# Patient Record
Sex: Female | Born: 1970 | Hispanic: Yes | Smoking: Never smoker
Health system: Southern US, Community
[De-identification: ages and names within clinical notes are randomized; demographics above are authoritative.]

## PROBLEM LIST (undated history)

## (undated) DIAGNOSIS — R7303 Prediabetes: Secondary | ICD-10-CM

## (undated) DIAGNOSIS — Z5189 Encounter for other specified aftercare: Secondary | ICD-10-CM

## (undated) DIAGNOSIS — E785 Hyperlipidemia, unspecified: Secondary | ICD-10-CM

## (undated) DIAGNOSIS — I10 Essential (primary) hypertension: Secondary | ICD-10-CM

## (undated) DIAGNOSIS — F419 Anxiety disorder, unspecified: Secondary | ICD-10-CM

## (undated) DIAGNOSIS — E119 Type 2 diabetes mellitus without complications: Secondary | ICD-10-CM

## (undated) DIAGNOSIS — I739 Peripheral vascular disease, unspecified: Secondary | ICD-10-CM

## (undated) DIAGNOSIS — R079 Chest pain, unspecified: Secondary | ICD-10-CM

## (undated) HISTORY — DX: Chest pain, unspecified: R07.9

## (undated) HISTORY — DX: Anxiety disorder, unspecified: F41.9

## (undated) HISTORY — PX: ABDOMINAL HYSTERECTOMY: SHX81

## (undated) HISTORY — DX: Prediabetes: R73.03

## (undated) HISTORY — DX: Type 2 diabetes mellitus without complications: E11.9

## (undated) HISTORY — PX: CHOLECYSTECTOMY: SHX55

## (undated) HISTORY — DX: Peripheral vascular disease, unspecified: I73.9

## (undated) HISTORY — DX: Hyperlipidemia, unspecified: E78.5

## (undated) HISTORY — DX: Encounter for other specified aftercare: Z51.89

---
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
## 1999-08-06 HISTORY — PX: CHOLECYSTECTOMY: SHX55

## 2015-02-20 ENCOUNTER — Other Ambulatory Visit (HOSPITAL_COMMUNITY): Payer: Self-pay | Admitting: Primary Care

## 2015-02-20 DIAGNOSIS — Z1231 Encounter for screening mammogram for malignant neoplasm of breast: Secondary | ICD-10-CM

## 2015-03-02 ENCOUNTER — Ambulatory Visit (HOSPITAL_COMMUNITY)
Admission: RE | Admit: 2015-03-02 | Discharge: 2015-03-02 | Disposition: A | Discharge status: Home / Self Care | Payer: Self-pay | Source: Ambulatory Visit | Attending: Primary Care | Admitting: Primary Care

## 2015-03-02 DIAGNOSIS — Z1231 Encounter for screening mammogram for malignant neoplasm of breast: Secondary | ICD-10-CM

## 2017-03-10 ENCOUNTER — Ambulatory Visit: Payer: Self-pay | Admitting: Urgent Care

## 2017-05-23 ENCOUNTER — Encounter: Payer: Self-pay | Admitting: Family Medicine

## 2017-05-23 ENCOUNTER — Ambulatory Visit (INDEPENDENT_AMBULATORY_CARE_PROVIDER_SITE_OTHER): Payer: Self-pay | Admitting: Family Medicine

## 2017-05-23 VITALS — BP 144/28 | HR 81 | Temp 98.1°F | Resp 18 | Ht 59.06 in | Wt 137.2 lb

## 2017-05-23 DIAGNOSIS — G8929 Other chronic pain: Secondary | ICD-10-CM

## 2017-05-23 DIAGNOSIS — M545 Low back pain: Secondary | ICD-10-CM

## 2017-05-23 NOTE — Patient Instructions (Addendum)
1. Puede ultilizar acetaminophen 500mg  cada 6 horas o ibuprofen 600mg  cada 8 horas para el dolor.  2. Alternar tratamientos de frio y calor.    IF you received an x-ray today, you will receive an invoice from [facility]. Please contact [facility] at [phone] with questions or concerns regarding your invoice.   IF you received labwork today, you will receive an invoice from [facility] [facility]. Please contact [facility] at [phone] with questions or concerns regarding your invoice.   Our billing staff will not be able to assist you with questions regarding bills from these companies.  You will be contacted with the lab results as soon as they are available. The fastest way to get your results is to activate your My Chart account. Instructions are located on the last page of this paperwork. If you have not heard from [place] regarding the results in 2 weeks, please contact this office.     Ejercicios para la espalda (Back Exercises) Si tiene dolor de espalda, haga estos ejercicios 2 o 3veces por da, o como se lo haya indicado el mdico. Cuando el dolor desaparezca, hgalos una vez por da, pero haga ms repeticiones de cada ejercicio. Si no le duele la espalda, haga estos ejercicios una vez por da o como se lo haya indicado el mdico. EJERCICIOS Rodilla al pecho Repita estos pasos 3 o 5veces seguidas con cada pierna: 1. Acustese boca arriba sobre una cama dura o sobre el suelo con las piernas extendidas. 2. Lleve una rodilla al pecho. 3. [place]. Para lograrlo tmese la rodilla o el muslo. 4. Tire de la rodilla hasta sentir una elongacin suave en la parte baja de la espalda. 5. Mantenga la elongacin durante 10 a 30segundos. 6. Suelte y extienda la pierna lentamente. Inclinacin de la pelvis Repita estos pasos 5 o 10veces seguidas: 1. Acustese boca arriba sobre una cama dura o sobre el suelo con las piernas extendidas. 2. Flexione las  rodillas de manera que apunten al techo. Los pies deben estar apoyados en el suelo. 3. Contraiga los msculos de la parte baja del vientre (abdomen) para empujar la zona lumbar contra el suelo. Este movimiento har que el cccix apunte hacia el techo, en lugar de apuntar hacia abajo en direccin a los pies o al suelo. 4. Mantenga esta posicin durante 5 a 10segundos mientras contrae suavemente los msculos y respira con normalidad. El perro y el gato Repita estos pasos hasta que la zona lumbar se curve con ms facilidad: 1. [place] manos y las rodillas sobre una superficie firme. Las manos deben estar alineadas con los hombros y las rodillas con las caderas. Puede colocarse almohadillas debajo de las rodillas. 2. Deje caer la cabeza y lleve el cccix hacia abajo de modo que apunte en direccin al suelo para que la zona lumbar se arquee como el lomo de un gato [place]. 3. Mantenga esta posicin durante 5segundos. 4. Lentamente, levante la cabeza y lleve el cccix hacia arriba de modo que apunte en direccin al techo para que la espalda se arquee (hunda) como el lomo de un perro contento. 5. Mantenga esta posicin durante 5segundos. Flexiones de brazos Repita estos pasos 5 o 10veces seguidas: 1. Acustese boca abajo en el suelo. 2. Ponga las manos cerca de la cabeza, separadas aproximadamente al ancho de los hombros. 3. Con la espalda relajada y las caderas apoyadas en el suelo, extienda lentamente los brazos para levantar la mitad superior del cuerpo y  elevar los hombros. No use los msculos de la espalda. Para estar ms cmodo, puede cambiar la [employer]. 4. Mantenga esta posicin durante 5segundos. 5. Lentamente vuelva a la posicin horizontal. Puentes Repita estos pasos 10veces seguidas: 1. Acustese boca arriba sobre una superficie firme. 2. Flexione las rodillas de manera que apunten al techo. Los pies deben estar apoyados en el suelo. 3. Contraiga los  glteos y despegue las nalgas del suelo hasta que la cintura est casi a la altura de las rodillas. Si no siente el trabajo muscular en las nalgas y la parte posterior de los muslos, aleje los pies 1 o 2pulgadas (2,5 o 5centmetros) de las nalgas. 4. Mantenga esta posicin durante 3 a 5segundos. 5. Lentamente, vuelva a apoyar las nalgas en el suelo y relaje los glteos. Si este ejercicio le resulta muy fcil, intente realizarlo con los brazos cruzados [place]. Abdominales Repita estos pasos 5 o 10veces seguidas: 1. Acustese boca arriba sobre una cama dura o sobre el suelo con las piernas extendidas. 2. Flexione las rodillas de manera que apunten al techo. Los pies deben estar apoyados en el suelo. 3. Cruce los [employer]. 4. Baje levemente el mentn en direccin al pecho, pero no doble el cuello. 5. Contraiga los msculos del abdomen y con lentitud eleve el pecho lo suficiente como para despegar levemente los omplatos del suelo. 6. Lentamente baje el pecho y la cabeza hasta el suelo. Elevaciones de espalda Repita estos pasos 5 o 10veces seguidas: 1. Acustese boca abajo con los brazos a los costados y apoye la frente en el suelo. 2. Contraiga los msculos de las piernas y los glteos. 3. Lentamente despegue el pecho del suelo mientras mantiene las caderas apoyadas en el suelo. Mantenga la nuca alineada con la curvatura de la espalda. Mire hacia el suelo mientras hace este ejercicio. 4. Mantenga esta posicin durante 3 a 5segundos. 5. Lentamente baje el pecho y el rostro hasta el suelo. SOLICITE AYUDA SI:  El dolor de espalda se vuelve mucho ms intenso cuando hace un ejercicio.  El dolor de espalda no se [place] 2horas despus de [employer] ejercicios. Si tiene alguno de [employer], deje de [employer] ejercicios. No vuelva a hacer los ejercicios a menos que el mdico lo autorice. SOLICITE AYUDA DE INMEDIATO SI:  Siente un dolor sbito y muy intenso en la  espalda. Si esto ocurre, deje de [employer]. No vuelva a hacer los ejercicios a menos que el mdico lo autorice. Esta informacin no tiene Marine scientist el consejo del mdico. Asegrese de hacerle al mdico cualquier pregunta que tenga. Document Released: 11/06/2010 Document Revised: 11/13/2015 Document Reviewed: 09/15/2014 Elsevier Interactive Patient Education  [employer].

## 2017-05-23 NOTE — Progress Notes (Signed)
10/19/201811:39 AM  [place] [birth year], 46 y.o. female [id number]  Chief Complaint  Patient presents with  . Back Pain    X 2 mth- lower back    HPI:   Patient is a 46 y.o. female with no significant past medical history who presents today for 2 months of back pain, lower, across waistline, achy, mild, mostly at night, sleeping on her side in fetal position is the most comfortable. Denies any radiation of her pain, changes in bowel or bladder, any inciting events. Has not taken any OTC meds. Reports that sometimes she gets nauseous when she has the pain.   Depression screen PHQ 2/9 05/23/2017  Decreased Interest 0  Down, Depressed, Hopeless 0  PHQ - 2 Score 0    No Known Allergies  Prior to Admission medications   Medication Sig Start Date End Date Taking? Authorizing Provider  Cholecalciferol (VITAMIN D PO) Take by mouth.   Yes [provider]    History reviewed. No pertinent past medical history.  Past Surgical History:  Procedure Laterality Date  . ABDOMINAL HYSTERECTOMY      Social History  Substance Use Topics  . Smoking status: Never Smoker  . Smokeless tobacco: Never Used  . Alcohol use No    Family History  Problem Relation Age of Onset  . Hyperlipidemia Mother   . Diabetes Father   . Hypertension Father   . Diabetes Sister   . Diabetes Brother     Review of Systems  Constitutional: Negative for chills and fever.  Respiratory: Negative for cough and shortness of breath.   Cardiovascular: Negative for chest pain, palpitations and leg swelling.  Gastrointestinal: Positive for nausea. Negative for abdominal pain and vomiting.  Genitourinary: Negative for dysuria and hematuria.  Musculoskeletal: Positive for back pain.  Neurological: Negative for tingling, sensory change and focal weakness.     OBJECTIVE:  Blood pressure (!) 144/28, pulse 81, temperature 98.1 F (36.7 C), temperature source Oral, resp. rate 18, height 4'  11.06" (1.5 m), weight 137 lb 3.2 oz (62.2 kg), SpO2 99 %.  Physical Exam  Constitutional: She is oriented to person, place, and time and well-developed, well-nourished, and in no distress.  HENT:  Head: Normocephalic and atraumatic.  Mouth/Throat: Oropharynx is clear and moist. No oropharyngeal exudate.  Eyes: Pupils are equal, round, and reactive to light. EOM are normal. No scleral icterus.  Neck: Neck supple.  Cardiovascular: Normal rate, regular rhythm and normal heart sounds.  Exam reveals no gallop and no friction rub.   No murmur heard. Pulmonary/Chest: Effort normal and breath sounds normal. She has no wheezes. She has no rales.  Musculoskeletal: She exhibits no edema.       Lumbar back: She exhibits normal range of motion, no tenderness, no bony tenderness, no pain and no spasm.  Neurological: She is alert and oriented to person, place, and time. She has normal sensation and normal strength. She has a normal Straight Leg Raise Test. Gait normal.  Reflex Scores:      Patellar reflexes are 1+ on the right side and 1+ on the left side.      Achilles reflexes are 1+ on the right side and 1+ on the left side. Skin: Skin is warm and dry.    ASSESSMENT and PLAN  1. Chronic bilateral low back pain without sciatica Discussed conservative measures with OTC acetaminophen/nsaids, heat/ice, gentle stretching and strengthening. Patient educational handout given. RTC precautions discussed.    Return if symptoms worsen  or fail to improve.    Rutherford Guys, MD Primary [facility] at [facility] [place], [place] [address] Ph.  [phone] Fax [phone]

## 2017-08-05 DIAGNOSIS — I10 Essential (primary) hypertension: Secondary | ICD-10-CM

## 2017-08-05 HISTORY — DX: Essential (primary) hypertension: I10

## 2018-09-23 ENCOUNTER — Other Ambulatory Visit: Payer: Self-pay

## 2018-09-23 ENCOUNTER — Emergency Department (HOSPITAL_COMMUNITY)
Admission: EM | Admit: 2018-09-23 | Discharge: 2018-09-24 | Disposition: A | Discharge status: Home / Self Care | Payer: Self-pay | Attending: Emergency Medicine | Admitting: Emergency Medicine

## 2018-09-23 ENCOUNTER — Emergency Department (HOSPITAL_COMMUNITY): Payer: Self-pay

## 2018-09-23 DIAGNOSIS — X58XXXA Exposure to other specified factors, initial encounter: Secondary | ICD-10-CM | POA: Insufficient documentation

## 2018-09-23 DIAGNOSIS — R0789 Other chest pain: Secondary | ICD-10-CM | POA: Insufficient documentation

## 2018-09-23 DIAGNOSIS — R0602 Shortness of breath: Secondary | ICD-10-CM | POA: Insufficient documentation

## 2018-09-23 DIAGNOSIS — I10 Essential (primary) hypertension: Secondary | ICD-10-CM | POA: Insufficient documentation

## 2018-09-23 DIAGNOSIS — T148XXA Other injury of unspecified body region, initial encounter: Secondary | ICD-10-CM

## 2018-09-23 DIAGNOSIS — Y999 Unspecified external cause status: Secondary | ICD-10-CM | POA: Insufficient documentation

## 2018-09-23 DIAGNOSIS — Y929 Unspecified place or not applicable: Secondary | ICD-10-CM | POA: Insufficient documentation

## 2018-09-23 DIAGNOSIS — Z79899 Other long term (current) drug therapy: Secondary | ICD-10-CM | POA: Insufficient documentation

## 2018-09-23 DIAGNOSIS — R079 Chest pain, unspecified: Secondary | ICD-10-CM

## 2018-09-23 DIAGNOSIS — S29011A Strain of muscle and tendon of front wall of thorax, initial encounter: Secondary | ICD-10-CM | POA: Insufficient documentation

## 2018-09-23 DIAGNOSIS — Y939 Activity, unspecified: Secondary | ICD-10-CM | POA: Insufficient documentation

## 2018-09-23 LAB — BASIC METABOLIC PANEL
Anion gap: 8 (ref 5–15)
BUN: 11 mg/dL (ref 6–20)
CO2: 27 mmol/L (ref 22–32)
Calcium: 9.3 mg/dL (ref 8.9–10.3)
Chloride: 102 mmol/L (ref 98–111)
Creatinine, Ser: 0.53 mg/dL (ref 0.44–1.00)
GFR calc Af Amer: >60 mL/min (ref >60)
GFR calc non Af Amer: >60 mL/min (ref >60)
Glucose, Bld: 109 mg/dL — ABNORMAL HIGH (ref 70–99)
Potassium: 3.5 mmol/L (ref 3.5–5.1)
Sodium: 137 mmol/L (ref 135–145)

## 2018-09-23 LAB — CBC
HCT: 42.3 % (ref 36.0–46.0)
Hemoglobin: 13.6 g/dL (ref 12.0–15.0)
MCH: 30.2 pg (ref 26.0–34.0)
MCHC: 32.2 g/dL (ref 30.0–36.0)
MCV: 94 fL (ref 80.0–100.0)
Platelets: 309 10*3/uL (ref 150–400)
RBC: 4.5 MIL/uL (ref 3.87–5.11)
RDW: 11.4 % — ABNORMAL LOW (ref 11.5–15.5)
WBC: 7.9 10*3/uL (ref 4.0–10.5)
nRBC: 0 % (ref 0.0–0.2)

## 2018-09-23 LAB — I-STAT BETA HCG BLOOD, ED (MC, WL, AP ONLY): I-stat hCG, quantitative: <5 m[IU]/mL (ref <5)

## 2018-09-23 LAB — I-STAT TROPONIN, ED: Troponin i, poc: 0 ng/mL (ref 0.00–0.08)

## 2018-09-23 MED ORDER — SODIUM CHLORIDE 0.9% FLUSH: 3.0000 mL | Freq: Once | INTRAVENOUS | Status: DC

## 2018-09-23 NOTE — ED Triage Notes (Signed)
Patient c/o intermittent CP x2 weeks, worsening today. Denies N/V and but has had mild SOB.

## 2018-09-24 NOTE — Discharge Instructions (Addendum)
Puede tomar Motrin (Ibuprofen) o Aleve (Naproxen), Acetaminophen (Tylenol), crema para los musculos como SalonPas, Icy Hot, Bengay, etc. Puede estrechar, ponerce hielo o comprecion de calor, o que le den masaje. ° °

## 2018-09-24 NOTE — ED Notes (Signed)
Patient verbalizes understanding of discharge instructions. Opportunity for questioning and answers were provided. Armband removed by staff, pt discharged from ED ambulatory.   

## 2018-09-24 NOTE — ED Provider Notes (Signed)
[facility] EMERGENCY DEPARTMENT Provider Note  CSN: [id number] Arrival date & time: 09/23/18 2250  Chief Complaint(s) Chest Pain  HPI Stephanie Dougherty is a 48 y.o. female with a history of high blood pressure and high cholesterol who presents to the emergency department with 2 weeks of left arm pain that has spread to her left upper chest.  Pain is a constant ache which is exacerbated with movement and palpation.  At times patient reports feeling stabbing pains that are severe.  Pain is not exertional.  No other alleviating or aggravating factors.  At those times she endorses mild shortness of breath.  Denies any associated fevers or chills.  No coughing or congestion.  No nausea or vomiting.  No abdominal pain.  Denies any prior history of blood clots, personal history of cancer, prolonged immobilization.  The history is provided by the patient.    Past Medical History No past medical history on file. There are no active problems to display for this patient.  Home Medication(s) Prior to Admission medications   Medication Sig Start Date End Date Taking? Authorizing Provider  amLODipine (NORVASC) 5 MG tablet Take 5 mg by mouth daily. 09/22/18   [provider]  Cholecalciferol (VITAMIN D PO) Take by mouth.    [provider]  lovastatin (MEVACOR) 20 MG tablet Take 20 mg by mouth at bedtime. 09/22/18   [provider]                                                                                                                                    Past Surgical History Past Surgical History:  Procedure Laterality Date  . ABDOMINAL HYSTERECTOMY     Family History Family History  Problem Relation Age of Onset  . Hyperlipidemia Mother   . Diabetes Father   . Hypertension Father   . Diabetes Sister   . Diabetes Brother     Social History Social History   Tobacco Use  . Smoking status: Never Smoker  . Smokeless tobacco: Never Used   Substance Use Topics  . Alcohol use: No  . Drug use: No   Allergies Patient has no known allergies.  Review of Systems Review of Systems All other systems are reviewed and are negative for acute change except as noted in the HPI  Physical Exam Vital Signs  I have reviewed the triage vital signs BP 115/73 (BP Location: Left Arm)   Pulse 80   Temp 98.2 F (36.8 C) (Oral)   Resp 18   Ht 5' (1.524 m)   Wt 60.8 kg   SpO2 100%   BMI 26.17 kg/m   Physical Exam Vitals signs reviewed.  Constitutional:      General: She is not in acute distress.    Appearance: She is well-developed. She is not diaphoretic.  HENT:     Head: Normocephalic and atraumatic.     Nose: Nose  normal.  Eyes:     General: No scleral icterus.       Right eye: No discharge.        Left eye: No discharge.     Conjunctiva/sclera: Conjunctivae normal.     Pupils: Pupils are equal, round, and reactive to light.  Neck:     Musculoskeletal: Normal range of motion and neck supple.  Cardiovascular:     Rate and Rhythm: Normal rate and regular rhythm.     Heart sounds: No murmur. No friction rub. No gallop.   Pulmonary:     Effort: Pulmonary effort is normal. No respiratory distress.     Breath sounds: Normal breath sounds. No stridor. No rales.    Chest:     Chest wall: Tenderness present.    Abdominal:     General: There is no distension.     Palpations: Abdomen is soft.     Tenderness: There is no abdominal tenderness.  Musculoskeletal:        General: No tenderness.  Skin:    General: Skin is warm and dry.     Findings: No erythema or rash.  Neurological:     Mental Status: She is alert and oriented to person, place, and time.     ED Results and Treatments Labs (all labs ordered are listed, but only abnormal results are displayed) Labs Reviewed  BASIC METABOLIC PANEL - Abnormal; Notable for the following components:      Result Value   Glucose, Bld 109 (*)    All other components  within normal limits  CBC - Abnormal; Notable for the following components:   RDW 11.4 (*)    All other components within normal limits  I-STAT TROPONIN, ED  I-STAT BETA HCG BLOOD, ED ([facility], [facility], [facility] ONLY)                                                                                                                         EKG  EKG Interpretation  Date/Time:  Wednesday September 23 2018 22:59:35 EST Ventricular Rate:  84 PR Interval:  142 QRS Duration: 80 QT Interval:  348 QTC Calculation: 411 R Axis:   39 Text Interpretation:  Normal sinus rhythm Cannot rule out Anterior infarct , age undetermined Abnormal ECG No old tracing to compare Confirmed by Delora Fuel ([id number]) on 09/23/2018 11:04:26 PM      Radiology Dg Chest 2 View  Result Date: 09/23/2018 CLINICAL DATA:  Chest pain EXAM: CHEST - 2 VIEW COMPARISON:  None. FINDINGS: The heart size and mediastinal contours are within normal limits. Both lungs are clear. The visualized skeletal structures are unremarkable. IMPRESSION: No active cardiopulmonary disease. Electronically Signed   By: Donavan Foil M.D.   On: 09/23/2018 23:24   Pertinent labs & imaging results that were available during my care of the patient were reviewed by me and considered in my medical decision making (see chart for details).  Medications Ordered in ED Medications  sodium chloride  flush (NS) 0.9 % injection 3 mL (has no administration in time range)                                                                                                                                    Procedures Procedures  (including critical care time)  Medical Decision Making / ED Course I have reviewed the nursing notes for this encounter and the patient's prior records (if available in EHR or on provided paperwork).    Patient presents with left-sided chest and arm pain most consistent with muscular etiology.  Inconsistent with ACS.  EKG without acute ischemic changes or  evidence of pericarditis.  Troponin negative.  Given the duration of her symptoms feel this sufficient to rule out ACS.  Presentation is not classic for aortic dissection or esophageal perforation.  Low pretest probability for pulmonary embolism and patient is PERC negative.  Chest x-ray without evidence suggestive of pneumonia, pneumothorax, pneumomediastinum.  No abnormal contour of the mediastinum to suggest dissection. No evidence of acute injuries.  The patient appears reasonably screened and/or stabilized for discharge and I doubt any other medical condition or other [facility] requiring further screening, evaluation, or treatment in the ED at this time prior to discharge.  The patient is safe for discharge with strict return precautions.   Final Clinical Impression(s) / ED Diagnoses Final diagnoses:  Left-sided chest pain  Muscle strain   Disposition: Discharge  Condition: Good  I have discussed the results, Dx and Tx plan with the patient who expressed understanding and agree(s) with the plan. Discharge instructions discussed at great length. The patient was given strict return precautions who verbalized understanding of the instructions. No further questions at time of discharge.    ED Discharge Orders    None       Follow Up: Primary care provider  Schedule an appointment as soon as possible for a visit         This chart was dictated using voice recognition software.  Despite best efforts to proofread,  errors can occur which can change the documentation meaning.   Fatima Blank, MD 09/24/18 [phone]

## 2018-12-17 ENCOUNTER — Other Ambulatory Visit: Payer: Self-pay

## 2018-12-17 ENCOUNTER — Ambulatory Visit: Payer: Self-pay | Admitting: Internal Medicine

## 2018-12-17 ENCOUNTER — Encounter: Payer: Self-pay | Admitting: Internal Medicine

## 2018-12-17 VITALS — BP 140/98 | HR 80 | Resp 12 | Ht <= 58 in | Wt 140.0 lb

## 2018-12-17 DIAGNOSIS — E7800 Pure hypercholesterolemia, unspecified: Secondary | ICD-10-CM

## 2018-12-17 DIAGNOSIS — Z23 Encounter for immunization: Secondary | ICD-10-CM

## 2018-12-17 DIAGNOSIS — I10 Essential (primary) hypertension: Secondary | ICD-10-CM

## 2018-12-17 DIAGNOSIS — R7989 Other specified abnormal findings of blood chemistry: Secondary | ICD-10-CM

## 2018-12-17 DIAGNOSIS — R739 Hyperglycemia, unspecified: Secondary | ICD-10-CM

## 2018-12-17 DIAGNOSIS — R0789 Other chest pain: Secondary | ICD-10-CM

## 2018-12-17 MED ORDER — AMLODIPINE BESYLATE 5 MG PO TABS: 5.0000 mg | ORAL_TABLET | Freq: Every day | ORAL | 6 refills | Status: DC

## 2018-12-17 MED ORDER — AMLODIPINE BESYLATE 5 MG PO TABS: 5.0000 mg | ORAL_TABLET | Freq: Every day | ORAL | 1 refills | Status: DC

## 2018-12-17 NOTE — Progress Notes (Signed)
Subjective:    Patient ID: Stephanie Dougherty, female   DOB: 12/19/1970, 48 y.o.   MRN: [id number]   HPI   Here to establish Her son, [place], interprets.  1. Hypertension:  Diagnosed just under 1 year ago.  She is currently taking Amlodipine half of a 5 mg tab daily as she was running out.  When taking 5 mg daily, her bp was 118/79.   Ran out completely yesterday  2.  Hypercholesterolemia:  Was taking Mevacor at one point.  States her total was about 250 when started.   When they checked her cholesterol again, her cholesterol was at a good level, so they stopped it.    3.  History of hyperglycemia:  She is aware of this in past, but last check was 98.    4.  History of low Vitamin D:  States a recheck with supplementation was normal.    5.  Numbness in left arm with high left chest wall pressure:  Went to ED 09/23/2018.  Had minor abnormalities in lead V3 and probably inconsequential Q waves in inferior leads.  Troponin I was negative. CXR was normal. Gets the discomfort sleeping on the left--arm goes to sleep and pressure on chest.. She gets discomfort with stress--like if her bp is high.  Checks her bp 3 times daily.      Current Meds  Medication Sig  . amLODipine (NORVASC) 5 MG tablet Take 5 mg by mouth daily.  . Cholecalciferol (VITAMIN D3) 25 MCG (1000 UT) CAPS Take by mouth daily.  . Omega-3 Fatty Acids (OMEGA 3 500 PO) Take 1 capsule by mouth daily.   No Known Allergies   Past Medical History:  Diagnosis Date  . Hyperglycemia   . Hyperlipidemia   . Hypertension 2019    Past Surgical History:  Procedure Laterality Date  . ABDOMINAL HYSTERECTOMY     No oophorectomy  . CHOLECYSTECTOMY     Family History  Problem Relation Age of Onset  . Hyperlipidemia Mother   . Diabetes Father   . Hypertension Father   . Diabetes Sister   . Diabetes Brother     Social History   Tobacco Use  . Smoking status: Never Smoker  . Smokeless tobacco: Never Used  Substance  Use Topics  . Alcohol use: No   No history of drug use.  Review of Systems    Objective:   BP (!) 140/98 (BP Location: Left Arm, Cuff Size: Normal)   Pulse 80   Resp 12   Ht 4' 9.25" (1.454 m)   Wt 140 lb (63.5 kg)   LMP  (LMP Unknown)   BMI 30.03 kg/m   Physical Exam  NAD HEENT:  PERRL EOMI, TMs pearly gray, throat without injection. Neck:  Supple, No adenopathy, no thyromegaly Chest:  CTA.  Tender with palpation of lateral pectoral muscles--able to isolate and tender.  Reproduces chest pain. CV:  RRR with normal S1 and S2, No S3, S4 or murmur.  No carotid bruits,  Carotid, radial and DP pulses normal and equal. Abd:  S, NT, No HSM or mass, + BS LE:  No edema.    Assessment & Plan  1.  Hypertension:  Refill Amlodipine 5 mg daily. Fasting labs in next 2 weeks with BP and pulse check.  2.  Hypercholesterolemia:  FLP with fasting labs  3.  Hyperglycemia:  CMP, A1C with fasting labs.  4.  Chest wall pain:  Follow for now.  May try ibuprofen  for pain.  Avoid motion causing discomfort.  Stretches  5.  HM:  Tdap.  Add CBC to fasting labs in follow up  CPE without pap in 4 months

## 2018-12-23 ENCOUNTER — Encounter: Payer: Self-pay | Admitting: Internal Medicine

## 2018-12-23 DIAGNOSIS — R7989 Other specified abnormal findings of blood chemistry: Secondary | ICD-10-CM | POA: Insufficient documentation

## 2018-12-23 DIAGNOSIS — R739 Hyperglycemia, unspecified: Secondary | ICD-10-CM | POA: Insufficient documentation

## 2018-12-23 DIAGNOSIS — E785 Hyperlipidemia, unspecified: Secondary | ICD-10-CM | POA: Insufficient documentation

## 2019-01-06 ENCOUNTER — Other Ambulatory Visit: Payer: Self-pay

## 2019-01-06 ENCOUNTER — Encounter: Payer: Self-pay | Admitting: Internal Medicine

## 2019-01-06 ENCOUNTER — Ambulatory Visit: Payer: Self-pay | Admitting: Internal Medicine

## 2019-01-06 VITALS — BP 138/88 | HR 80 | Temp 98.2°F | Resp 12 | Ht <= 58 in | Wt 133.0 lb

## 2019-01-06 DIAGNOSIS — I8393 Asymptomatic varicose veins of bilateral lower extremities: Secondary | ICD-10-CM

## 2019-01-06 NOTE — Patient Instructions (Signed)
Measure your ankle, calf and thigh first thing in the morning and order compression stockings from those measurements--thigh high or with panty if thigh high not what you want. Put them on first thing in morning and take off before bedtime.  Recline and elevate legs when sitting.  Move when standing--get back to your walks  Avoid salt

## 2019-01-06 NOTE — Progress Notes (Signed)
    Subjective:    Patient ID: Stephanie Dougherty, female   DOB: [birth year], 48 y.o.   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  Worse after her second pregnancy with her youngest son about 10 years ago. In past week, has not been able to get an relief from the varicosity.  Has had problems with the varicosity in her ankle with sense of heat for years, but just no relief in past week.   She states she has not been walking as much in past 3 months due to the pandemic.   Previously, would go walking on a track and nearby school over a 3 hours period on a daily basis.  Again, this stopped with the pandemic. Has been to vein specialist in past.  Was told to walk more and eat less red meat.   Only really elevated her legs if she is having pain.  May put her feet up on a low table when sits.   May sit up or recline back depending on what support she has at the time. No recliner.   Current Meds  Medication Sig  . amLODipine (NORVASC) 5 MG tablet Take 1 tablet (5 mg total) by mouth daily.  . Cholecalciferol (VITAMIN D3) 25 MCG (1000 UT) CAPS Take by mouth daily.  . Omega-3 Fatty Acids (OMEGA 3 500 PO) Take 1 capsule by mouth daily.   Allergies  Allergen Reactions  . Losartan Palpitations     Review of Systems    Objective:   BP 138/88 (BP Location: Left Arm, Patient Position: Sitting, Cuff Size: Normal)   Pulse 80   Temp 98.2 F (36.8 C) (Temporal)   Resp 12   Ht 4' 9.25" (1.454 m)   Wt 133 lb (60.3 kg)   LMP  (LMP Unknown)   BMI 28.53 kg/m   Physical Exam  NAD Bilateral legs with spots of broken veins/spider veins scattered over legs Soft and compressible varicosities without erythema of right inner thigh, calf, ankle where she is having discomfort.    Similar and more involved left medial lower leg with compressible and soft varicosities.  No leg edema currently  Assessment & Plan   LE varicosities:   Discussed elevation of legs with reclining when sitting.   Graduated compression stockings for when up and about during the day. Getting back to walking and to move when on feet. Avoid salt. Stay cool. Would give it time with these measure before considering surgery/laser

## 2019-01-07 ENCOUNTER — Other Ambulatory Visit: Payer: Self-pay

## 2019-01-08 ENCOUNTER — Other Ambulatory Visit: Payer: Self-pay

## 2019-01-08 DIAGNOSIS — Z79899 Other long term (current) drug therapy: Secondary | ICD-10-CM

## 2019-01-08 DIAGNOSIS — M545 Low back pain: Secondary | ICD-10-CM

## 2019-01-08 DIAGNOSIS — R739 Hyperglycemia, unspecified: Secondary | ICD-10-CM

## 2019-01-08 DIAGNOSIS — E7800 Pure hypercholesterolemia, unspecified: Secondary | ICD-10-CM

## 2019-01-08 DIAGNOSIS — G8929 Other chronic pain: Secondary | ICD-10-CM

## 2019-01-08 LAB — POCT URINALYSIS DIPSTICK
Bilirubin, UA: NEGATIVE
Blood, UA: NEGATIVE
Glucose, UA: NEGATIVE
Ketones, UA: NEGATIVE
Leukocytes, UA: NEGATIVE
Nitrite, UA: NEGATIVE
Protein, UA: NEGATIVE
Spec Grav, UA: <=1.005 — AB (ref 1.010–1.025)
Urobilinogen, UA: 0.2 E.U./dL
pH, UA: 6.5 (ref 5.0–8.0)

## 2019-01-09 LAB — COMPREHENSIVE METABOLIC PANEL
ALT: 32 IU/L (ref 0–32)
AST: 24 IU/L (ref 0–40)
Albumin/Globulin Ratio: 1.7 (ref 1.2–2.2)
Albumin: 4.7 g/dL (ref 3.8–4.8)
Alkaline Phosphatase: 51 IU/L (ref 39–117)
BUN/Creatinine Ratio: 17 (ref 9–23)
BUN: 10 mg/dL (ref 6–24)
Bilirubin Total: 0.5 mg/dL (ref 0.0–1.2)
CO2: 21 mmol/L (ref 20–29)
Calcium: 9 mg/dL (ref 8.7–10.2)
Chloride: 97 mmol/L (ref 96–106)
Creatinine, Ser: 0.6 mg/dL (ref 0.57–1.00)
GFR calc Af Amer: 127 mL/min/{1.73_m2} (ref >59)
GFR calc non Af Amer: 110 mL/min/{1.73_m2} (ref >59)
Globulin, Total: 2.7 g/dL (ref 1.5–4.5)
Glucose: 105 mg/dL — ABNORMAL HIGH (ref 65–99)
Potassium: 4.1 mmol/L (ref 3.5–5.2)
Sodium: 137 mmol/L (ref 134–144)
Total Protein: 7.4 g/dL (ref 6.0–8.5)

## 2019-01-09 LAB — CBC WITH DIFFERENTIAL/PLATELET
Basophils Absolute: 0 10*3/uL (ref 0.0–0.2)
Basos: 1 %
EOS (ABSOLUTE): 0.1 10*3/uL (ref 0.0–0.4)
Eos: 2 %
Hematocrit: 42.7 % (ref 34.0–46.6)
Hemoglobin: 14.1 g/dL (ref 11.1–15.9)
Immature Grans (Abs): 0 10*3/uL (ref 0.0–0.1)
Immature Granulocytes: 0 %
Lymphocytes Absolute: 2.2 10*3/uL (ref 0.7–3.1)
Lymphs: 42 %
MCH: 31 pg (ref 26.6–33.0)
MCHC: 33 g/dL (ref 31.5–35.7)
MCV: 94 fL (ref 79–97)
Monocytes Absolute: 0.3 10*3/uL (ref 0.1–0.9)
Monocytes: 6 %
Neutrophils Absolute: 2.5 10*3/uL (ref 1.4–7.0)
Neutrophils: 49 %
Platelets: 335 10*3/uL (ref 150–450)
RBC: 4.55 x10E6/uL (ref 3.77–5.28)
RDW: 11.7 % (ref 11.7–15.4)
WBC: 5.2 10*3/uL (ref 3.4–10.8)

## 2019-01-09 LAB — LIPID PANEL W/O CHOL/HDL RATIO
Cholesterol, Total: 210 mg/dL — ABNORMAL HIGH (ref 100–199)
HDL: 58 mg/dL (ref >39)
LDL Calculated: 118 mg/dL — ABNORMAL HIGH (ref 0–99)
Triglycerides: 168 mg/dL — ABNORMAL HIGH (ref 0–149)
VLDL Cholesterol Cal: 34 mg/dL (ref 5–40)

## 2019-01-09 LAB — HGB A1C W/O EAG: Hgb A1c MFr Bld: 5.8 % — ABNORMAL HIGH (ref 4.8–5.6)

## 2019-04-20 ENCOUNTER — Other Ambulatory Visit: Payer: Self-pay

## 2019-04-20 ENCOUNTER — Ambulatory Visit: Payer: Self-pay | Admitting: Internal Medicine

## 2019-04-20 ENCOUNTER — Encounter: Payer: Self-pay | Admitting: Internal Medicine

## 2019-04-20 VITALS — BP 136/88 | HR 70 | Resp 12 | Ht <= 58 in | Wt 135.0 lb

## 2019-04-20 DIAGNOSIS — R7303 Prediabetes: Secondary | ICD-10-CM

## 2019-04-20 DIAGNOSIS — N9089 Other specified noninflammatory disorders of vulva and perineum: Secondary | ICD-10-CM

## 2019-04-20 DIAGNOSIS — Z0000 Encounter for general adult medical examination without abnormal findings: Secondary | ICD-10-CM

## 2019-04-20 DIAGNOSIS — E782 Mixed hyperlipidemia: Secondary | ICD-10-CM

## 2019-04-20 DIAGNOSIS — Z1239 Encounter for other screening for malignant neoplasm of breast: Secondary | ICD-10-CM

## 2019-04-20 MED ORDER — AMLODIPINE BESYLATE 5 MG PO TABS: 5.0000 mg | ORAL_TABLET | Freq: Every day | ORAL | 11 refills | Status: DC

## 2019-04-20 NOTE — Patient Instructions (Signed)
Flu clinics on Sept 17 and Oct 15 8:30 a.m. to 11:30 a.m.   Voting registration with the Sept 17 clinic also

## 2019-04-20 NOTE — Progress Notes (Signed)
Subjective:    Patient ID: Stephanie Dougherty, female   DOB: February 21, 1971, 48 y.o.   MRN: [id number]   HPI   CPE without pap  1.  Pap:  Last pap smear in 2018 at [facility] and normal.    2.  Mammogram:  Feels she had last mammogram 2 years ago and normal.  We have a normal mammogram from 02/2015.  No family history of breast cancer.    3.  Osteoprevention:  Drinks soy milk and sometimes 2% milk daily.  She walks some days.  No family history of osteoporosis.  4.  Guaiac Cards:  Never.   5.  Colonoscopy:  Never.  No family history of colon cancer.  6.  Immunizations:   Immunization History  Administered Date(s) Administered  . Tdap 12/17/2018     7.  Glucose/Cholesterol:  Prediabetes 01/08/2019 with A1C of 5.8%.  Also hyperlipidemia noted same date. Has made changes with diet and somewhat with physical activity since June.  Had a lot of personal concerns going on so did not get started with physical activity until recently. Family was trying to support another family member in [place] and lot of stress as they could not travel to support nor afford to send a lot of money due to their own budget.   Things are fine now.  Lipid Panel     Component Value Date/Time   CHOL 210 (H) 01/08/2019 0840   TRIG 168 (H) 01/08/2019 0840   HDL 58 01/08/2019 0840   LDLCALC 118 (H) 01/08/2019 0840   Past Medical History:  Diagnosis Date  . Hyperlipidemia   . Hypertension 2019  . Prediabetes     Past Surgical History:  Procedure Laterality Date  . ABDOMINAL HYSTERECTOMY     No oophorectomy; emergent hysterectomy for postpartum bleeding.  . CHOLECYSTECTOMY     Family History  Problem Relation Age of Onset  . Hypertension Mother   . Diabetes Father   . Hypertension Father   . Lung disease Sister   . Diabetes Brother   . Diabetes Brother   . Diabetes Brother     Social History   Socioeconomic History  . Marital status: Significant Other    Spouse name: Ouida Sills  .  Number of children: 2  . Years of education: 6  . Highest education level: Not on file  Occupational History  . Not on file  Social Needs  . Financial resource strain: Not very hard  . Food insecurity    Worry: Never true    Inability: Never true  . Transportation needs    Medical: No    Non-medical: No  Tobacco Use  . Smoking status: Never Smoker  . Smokeless tobacco: Never Used  Substance and Sexual Activity  . Alcohol use: No  . Drug use: No  . Sexual activity: Not on file  Lifestyle  . Physical activity    Days per week: 7 days    Minutes per session: 30 min  . Stress: Not on file  Relationships  . Social Herbalist on phone: Not on file    Gets together: Not on file    Attends religious service: Not on file    Active member of club or organization: Not on file    Attends meetings of clubs or organizations: Not on file    Relationship status: Living with partner  . Intimate partner violence    Fear of current or ex partner: No  Emotionally abused: No    Physically abused: No    Forced sexual activity: No  Other Topics Concern  . Not on file  Social History Narrative   Lives with her female partner and 2 sons.      Current Meds  Medication Sig  . amLODipine (NORVASC) 5 MG tablet Take 1 tablet (5 mg total) by mouth daily.  . Cholecalciferol (VITAMIN D3) 25 MCG (1000 UT) CAPS Take by mouth daily.  . Omega-3 Fatty Acids (OMEGA 3 500 PO) Take 1 capsule by mouth daily.   Allergies  Allergen Reactions  . Losartan Palpitations     Review of Systems  Constitutional: Negative for appetite change and fever.  HENT: Negative for dental problem.   Gastrointestinal: Positive for abdominal pain (Has discomfort in right lower rib cage/RUQ, poking sensation, for past 2 days.  Comes and goes.  Notes when sitting for most part.  No change with eating, BM, or urination.  Does not keep her from doing her usual ADLs.). Negative for blood in stool (No melena or  hematochezia).  Genitourinary: Negative for dysuria and vaginal discharge.      Objective:   BP 136/88 (BP Location: Left Arm, Patient Position: Sitting, Cuff Size: Normal)   Pulse 70   Resp 12   Ht 4' 9.25" (1.454 m)   Wt 135 lb (61.2 kg)   LMP  (LMP Unknown)   BMI 28.96 kg/m   Physical Exam  Constitutional: She is oriented to person, place, and time. She appears well-developed and well-nourished.  HENT:  Head: Normocephalic and atraumatic.  Right Ear: Tympanic membrane, external ear and ear canal normal.  Left Ear: Tympanic membrane, external ear and ear canal normal.  Nose: Nose normal.  Mouth/Throat: Uvula is midline and oropharynx is clear and moist.  Eyes: Pupils are equal, round, and reactive to light. Conjunctivae and EOM are normal.  Discs sharp bilaterally  Neck: Normal range of motion and full passive range of motion without pain. Neck supple. No thyromegaly present.  Cardiovascular: Normal rate, regular rhythm, S1 normal and S2 normal. Exam reveals no S3, no S4 and no friction rub.  No murmur heard. No carotid bruits.  Carotid, radial, femoral, DP and PT pulses normal and equal.  Varicosities of bilateral LE  Pulmonary/Chest: Effort normal and breath sounds normal. Right breast exhibits no inverted nipple, no mass, no skin change and no tenderness. Left breast exhibits no inverted nipple, no mass, no nipple discharge, no skin change and no tenderness.  Abdominal: Soft. Bowel sounds are normal. She exhibits no mass. There is no hepatosplenomegaly. There is no abdominal tenderness. No hernia.  Genitourinary:       Genitourinary Comments: See diagram of skin tag. Otherwise normal external female genitalia.   No uterine or adnexal mass or tenderness. Rectal:  Heme negative light brown stool.   Musculoskeletal: Normal range of motion.  Lymphadenopathy:       Head (right side): No submental and no submandibular adenopathy present.       Head (left side): No  submental and no submandibular adenopathy present.    She has no cervical adenopathy.    She has no axillary adenopathy.       Right: No inguinal and no supraclavicular adenopathy present.       Left: No inguinal and no supraclavicular adenopathy present.  Neurological: She is alert and oriented to person, place, and time. She has normal strength and normal reflexes. No cranial nerve deficit or sensory deficit. Coordination  and gait normal.  Skin: Skin is warm. No lesion and no rash noted.  Psychiatric: She has a normal mood and affect. Her speech is normal and behavior is normal. Judgment and thought content normal. Cognition and memory are normal.     Assessment & Plan  1.  CPE without pap Mammogram ordered Guaiac Cards x 3 to return in 2 weeks. Flu vaccine clinic  2.  Prediabetes:  To get started on lifestyle changes.  A1C  3.  Left labial skin tag.  Due to the vascularity of the area, will ask gynecology to evaluate for removal.  4.  Hyperlipidemia:  FLP  5.  Hypertension:  Fair control  6.  Varicose veins:  Continue with recommendation in June

## 2019-04-21 LAB — LIPID PANEL W/O CHOL/HDL RATIO
Cholesterol, Total: 230 mg/dL — ABNORMAL HIGH (ref 100–199)
HDL: 56 mg/dL (ref >39)
LDL Chol Calc (NIH): 154 mg/dL — ABNORMAL HIGH (ref 0–99)
Triglycerides: 112 mg/dL (ref 0–149)
VLDL Cholesterol Cal: 20 mg/dL (ref 5–40)

## 2019-04-21 LAB — HGB A1C W/O EAG: Hgb A1c MFr Bld: 5.7 % — ABNORMAL HIGH (ref 4.8–5.6)

## 2019-04-28 ENCOUNTER — Other Ambulatory Visit (INDEPENDENT_AMBULATORY_CARE_PROVIDER_SITE_OTHER): Payer: Self-pay

## 2019-04-28 DIAGNOSIS — Z1211 Encounter for screening for malignant neoplasm of colon: Secondary | ICD-10-CM

## 2019-04-28 LAB — POC HEMOCCULT BLD/STL (HOME/3-CARD/SCREEN)
Card #2 Fecal Occult Blod, POC: NEGATIVE
Card #3 Fecal Occult Blood, POC: NEGATIVE
Fecal Occult Blood, POC: NEGATIVE

## 2019-06-18 ENCOUNTER — Ambulatory Visit (INDEPENDENT_AMBULATORY_CARE_PROVIDER_SITE_OTHER): Payer: Self-pay | Admitting: Obstetrics & Gynecology

## 2019-06-18 ENCOUNTER — Encounter: Payer: Self-pay | Admitting: Obstetrics & Gynecology

## 2019-06-18 ENCOUNTER — Other Ambulatory Visit: Payer: Self-pay

## 2019-06-18 VITALS — BP 129/85 | HR 83 | Wt 138.1 lb

## 2019-06-18 DIAGNOSIS — F520 Hypoactive sexual desire disorder: Secondary | ICD-10-CM

## 2019-06-18 DIAGNOSIS — N9089 Other specified noninflammatory disorders of vulva and perineum: Secondary | ICD-10-CM

## 2019-06-18 NOTE — Patient Instructions (Signed)
Triple antibiotic cream to area daily for one week.

## 2019-06-18 NOTE — Progress Notes (Signed)
GYNECOLOGY OFFICE VISIT NOTE  History:   Stephanie Dougherty is a 48 y.o. [phone] here today to request removal of labial skin tag. Patient is Spanish-speaking only, Spanish interpreter present for this encounter. Has been bothering her for years. Also reports decreased libido, she is s/p cesarean hysterectomy for postpartum hemorrhage and wonders if absence of uterus is causing this. Has cervix and ovaries, no vasomotor symptoms.  Also desires STI screen and pap smear today. She denies any current abnormal vaginal discharge, bleeding, pelvic pain or other concerns.    Past Medical History:  Diagnosis Date  . Hyperlipidemia   . Hypertension 2019  . Prediabetes     Past Surgical History:  Procedure Laterality Date  . ABDOMINAL HYSTERECTOMY     No oophorectomy; emergent hysterectomy for postpartum bleeding.  . CHOLECYSTECTOMY      The following portions of the patient's history were reviewed and updated as appropriate: allergies, current medications, past family history, past medical history, past social history, past surgical history and problem list.   Health Maintenance:  Normal pap and negative HRHPV in 2017.  Normal mammogram in 2016, scheduled in 08/10/2019.  Review of Systems:  Pertinent items noted in HPI and remainder of comprehensive ROS otherwise negative.  Physical Exam:  BP 129/85   Pulse 83   Wt 138 lb 1.6 oz (62.6 kg)   LMP  (LMP Unknown)   BMI 29.62 kg/m  CONSTITUTIONAL: Well-developed, well-nourished female in no acute distress.  HEENT:  Normocephalic, atraumatic. External right and left ear normal. No scleral icterus.  NECK: Normal range of motion, supple, no masses noted on observation SKIN: No rash noted. Not diaphoretic. No erythema. No pallor. MUSCULOSKELETAL: Normal range of motion. No edema noted. NEUROLOGIC: Alert and oriented to person, place, and time. Normal muscle tone coordination. No cranial nerve deficit noted. PSYCHIATRIC: Normal mood and  affect. Normal behavior. Normal judgment and thought content. CARDIOVASCULAR: Normal heart rate noted RESPIRATORY: Effort and breath sounds normal, no problems with respiration noted ABDOMEN: No masses noted. No other overt distention noted.   PELVIC: Normal appearing external genitalia except for 1 cm benign, flesh colored, smooth, skin tag with thin stalk noted in mid portion of left labium majus.   No abnormal discharge noted.   VULVAR LESION EXCISION OTE The indications for vulvar biopsy (patient's desire) was reviewed.   Risks of the biopsy including pain, bleeding, infection, inadequate specimen, scarring and need for additional procedures  were discussed. The patient stated understanding and agreed to undergo procedure today. Consent was signed,  time out performed.  The patient's vulva was prepped with Betadine. 1% lidocaine was injected into stalk base. Scalpel was used to excise the lesion.  Small bleeding was noted and hemostasis was achieved using silver nitrate and two interrupted stitches of 4-0 Vicryl.  The patient tolerated the procedure well.      Assessment and Plan:     1. Skin tag of labia Benign lesion, not sent to pathology due to cost concerns as per patient's desire.   Post-procedure instructions  (pelvic rest for two weeks) were given to the patient.  Told to apply thin strip of OTC triple antibiotic cream to area daily for one week. The patient is to call with heavy bleeding, fever greater than 100.4, foul smelling vaginal discharge or other concerns.  2. Lack of libido Advised this was multifactorial, not likely hormonal Stephanie Dougherty still has her ovaries, no signs of premature menopause yet.  Not a candidate for hormone therapy.  Routine preventative health maintenance measures emphasized, will be given information for free pap smear and also advised to go to [facility] for STI screening.  Please refer to After Visit Summary for other counseling recommendations.   Return for  any gynecologic concerns.    Total face-to-face time with patient: 20 minutes.  Over 50% of encounter was spent on counseling and coordination of care.   [name], MD, [place] for [employer], [place]

## 2019-07-06 ENCOUNTER — Encounter: Payer: Self-pay | Admitting: *Deleted

## 2019-08-10 ENCOUNTER — Ambulatory Visit
Admission: RE | Admit: 2019-08-10 | Discharge: 2019-08-10 | Disposition: A | Discharge status: Home / Self Care | Payer: No Typology Code available for payment source | Source: Ambulatory Visit | Attending: Internal Medicine | Admitting: Internal Medicine

## 2019-08-10 ENCOUNTER — Other Ambulatory Visit: Payer: Self-pay

## 2019-08-10 DIAGNOSIS — Z1239 Encounter for other screening for malignant neoplasm of breast: Secondary | ICD-10-CM

## 2019-10-19 ENCOUNTER — Ambulatory Visit: Payer: Self-pay | Admitting: Internal Medicine

## 2019-11-17 ENCOUNTER — Encounter: Payer: Self-pay | Admitting: Internal Medicine

## 2019-11-17 ENCOUNTER — Other Ambulatory Visit: Payer: Self-pay

## 2019-11-17 ENCOUNTER — Ambulatory Visit (INDEPENDENT_AMBULATORY_CARE_PROVIDER_SITE_OTHER): Payer: Self-pay | Admitting: Internal Medicine

## 2019-11-17 VITALS — BP 130/80 | HR 82 | Resp 12 | Ht <= 58 in | Wt 133.0 lb

## 2019-11-17 DIAGNOSIS — N9089 Other specified noninflammatory disorders of vulva and perineum: Secondary | ICD-10-CM

## 2019-11-17 DIAGNOSIS — E782 Mixed hyperlipidemia: Secondary | ICD-10-CM

## 2019-11-17 DIAGNOSIS — I8393 Asymptomatic varicose veins of bilateral lower extremities: Secondary | ICD-10-CM

## 2019-11-17 DIAGNOSIS — I10 Essential (primary) hypertension: Secondary | ICD-10-CM

## 2019-11-17 DIAGNOSIS — R7303 Prediabetes: Secondary | ICD-10-CM

## 2019-11-17 NOTE — Progress Notes (Signed)
    Subjective:    Patient ID: Stephanie Dougherty, female   DOB: 1971/08/01, 49 y.o.   MRN: [id number]   HPI   1.  Hypertension:  Has lost 2 lbs.  Not really physically active.  Runs 10 minutes daily with a dog.  Does stretches daily for her legs which she feels relieves her pain from varicose veins.   She could extend her time with the dog with walking beyond the running.  She describes a good diet, though could cut back on carbs.  Good fruit and veggie intake.  2.  Varicose veins:  Doing okay.  Does elevated legs when sitting.  She ordered the compression stockings with full panty and feels they really help.    3.  Prediabetes:  Discussed as in Hypertension.    4.  Hyperlipidemia:  Would like to work on diet and physical activity a bit more before rechecking.   Current Meds  Medication Sig  . amLODipine (NORVASC) 5 MG tablet Take 1 tablet (5 mg total) by mouth daily.  . Cholecalciferol (VITAMIN D3) 25 MCG (1000 UT) CAPS Take by mouth daily.  . Omega-3 Fatty Acids (OMEGA 3 500 PO) Take 1 capsule by mouth daily.   Allergies  Allergen Reactions  . Losartan Palpitations     Review of Systems    Objective:   BP 130/80 (BP Location: Left Arm, Patient Position: Sitting, Cuff Size: Normal)   Pulse 82   Resp 12   Ht 4' 9.25" (1.454 m)   Wt 133 lb (60.3 kg)   LMP  (LMP Unknown)   BMI 28.53 kg/m   Physical Exam  NAD HEENT:  PERRL, EOMI,  Neck:  Supple, No adenopathy Chest:  CTA CV:  RRR without murmur or rub.  Radial and DP pulses normal and equal LE:  Varicosities bilaterally   Assessment & Plan   1.  Hypertension:  controlled  2.  Prediabetes:  Mild.  Encouraged goals for increasing physical activity and to decrease carbs in diet.  3.  Vulvar skin tag:  Resolved issue after removal  4.  Hyperlipidemia:  Recheck after working on lifestyle more so in 3 months.  5.  Varicose Veins:  CPM.  Consider ablation with interventional radiology if so chooses due to  discomfort.  [place], [facility] or [facility] are options.

## 2019-11-17 NOTE — Patient Instructions (Signed)

## 2019-11-19 ENCOUNTER — Telehealth: Payer: Self-pay | Admitting: Internal Medicine

## 2019-11-19 NOTE — Telephone Encounter (Signed)
Patient called requesting Rx on amLODipine (NORVASC) 5 MG tablet to be called in at the default pharmacy.

## 2019-11-22 ENCOUNTER — Other Ambulatory Visit: Payer: Self-pay

## 2019-11-22 MED ORDER — AMLODIPINE BESYLATE 5 MG PO TABS: 5.0000 mg | ORAL_TABLET | Freq: Every day | ORAL | 11 refills | Status: DC

## 2019-11-22 NOTE — Telephone Encounter (Signed)
Rx was sent to [facility]

## 2020-04-14 ENCOUNTER — Other Ambulatory Visit: Payer: Self-pay

## 2020-04-19 ENCOUNTER — Encounter: Payer: Self-pay | Admitting: Internal Medicine

## 2020-04-21 ENCOUNTER — Other Ambulatory Visit: Payer: No Typology Code available for payment source

## 2020-05-12 ENCOUNTER — Other Ambulatory Visit (INDEPENDENT_AMBULATORY_CARE_PROVIDER_SITE_OTHER): Payer: Self-pay

## 2020-05-12 DIAGNOSIS — R739 Hyperglycemia, unspecified: Secondary | ICD-10-CM

## 2020-05-12 DIAGNOSIS — E782 Mixed hyperlipidemia: Secondary | ICD-10-CM

## 2020-05-13 LAB — HEMOGLOBIN A1C
Est. average glucose Bld gHb Est-mCnc: 117 mg/dL
Hgb A1c MFr Bld: 5.7 % — ABNORMAL HIGH (ref 4.8–5.6)

## 2020-05-13 LAB — LIPID PANEL W/O CHOL/HDL RATIO
Cholesterol, Total: 274 mg/dL — ABNORMAL HIGH (ref 100–199)
HDL: 56 mg/dL (ref >39)
LDL Chol Calc (NIH): 193 mg/dL — ABNORMAL HIGH (ref 0–99)
Triglycerides: 137 mg/dL (ref 0–149)
VLDL Cholesterol Cal: 25 mg/dL (ref 5–40)

## 2020-05-25 ENCOUNTER — Other Ambulatory Visit: Payer: Self-pay | Admitting: Internal Medicine

## 2020-06-07 ENCOUNTER — Ambulatory Visit: Payer: Self-pay | Admitting: Internal Medicine

## 2020-06-07 ENCOUNTER — Encounter: Payer: Self-pay | Admitting: Internal Medicine

## 2020-06-07 VITALS — BP 135/82 | HR 79 | Resp 12 | Ht <= 58 in | Wt 132.5 lb

## 2020-06-07 DIAGNOSIS — Z9189 Other specified personal risk factors, not elsewhere classified: Secondary | ICD-10-CM

## 2020-06-07 DIAGNOSIS — E7800 Pure hypercholesterolemia, unspecified: Secondary | ICD-10-CM

## 2020-06-07 DIAGNOSIS — I10 Essential (primary) hypertension: Secondary | ICD-10-CM

## 2020-06-07 DIAGNOSIS — Z23 Encounter for immunization: Secondary | ICD-10-CM

## 2020-06-07 DIAGNOSIS — Z0000 Encounter for general adult medical examination without abnormal findings: Secondary | ICD-10-CM

## 2020-06-07 DIAGNOSIS — Z1231 Encounter for screening mammogram for malignant neoplasm of breast: Secondary | ICD-10-CM

## 2020-06-07 DIAGNOSIS — R7303 Prediabetes: Secondary | ICD-10-CM

## 2020-06-07 NOTE — Progress Notes (Signed)
Subjective:    Patient ID: Stephanie Dougherty, female   DOB: 01/04/1971, 49 y.o.   MRN: [id number]   HPI   Interpreted by [place], adult son.  CPE without pap  1.  Pap:  Hysterectomy for postpartum hemorrhage.  Ovaries intact.  2.  Mammogram:  Last mammogram 08/2019 and normal.  No family history of breast cancer.  G2P2, did not nurse her children.  Has never taken hormones.  Prefers yearly mammogram after discussion.  3.  Osteoprevention:  Drinks soy milk.  Willing to drink 3-4 servings daily for calcium and Vitamin D.  Physically active 3 times weekly--cleaning.    4.  Guaiac Cards:  Negative guaiac testing 04/2019.    5.  Colonoscopy:  Never.  No family history of colon cancer.  6.  Immunizations:  Has not had influenza vaccine Immunization History  Administered Date(s) Administered   Influenza,inj,Quad PF,6+ Mos 04/25/2019   Influenza-Unspecified 04/22/2019   Tdap 12/17/2018     7.  Glucose/Cholesterol:  Prediabetes and hypercholesteremia.  A1C recently stable at 5.7% and cholesterol continues to climb with high LDL. Lipid Panel     Component Value Date/Time   CHOL 274 (H) 05/12/2020 0942   TRIG 137 05/12/2020 0942   HDL 56 05/12/2020 0942   LDLCALC 193 (H) 05/12/2020 0942   LABVLDL 25 05/12/2020 0942     Current Meds  Medication Sig   amLODipine (NORVASC) 5 MG tablet Take 1 tablet by mouth once daily   Cholecalciferol (VITAMIN D3) 25 MCG (1000 UT) CAPS Take by mouth daily.   Omega-3 Fatty Acids (OMEGA 3 500 PO) Take 1 capsule by mouth daily.   Allergies  Allergen Reactions   Losartan Palpitations   Past Medical History:  Diagnosis Date   Hyperlipidemia    Hypertension 2019   Prediabetes     Past Surgical History:  Procedure Laterality Date   ABDOMINAL HYSTERECTOMY     No oophorectomy; emergent hysterectomy for postpartum bleeding.   CHOLECYSTECTOMY  2001    Family History  Problem Relation Age of Onset   Hypertension Mother    Diabetes  Father    Hypertension Father    Lung disease Sister    Diabetes Brother    Obesity Son    Diabetes Brother    Diabetes Brother    Deep vein thrombosis Sister    Obesity Son     Social History   Socioeconomic History   Marital status: Soil scientist    Spouse name: Ouida Sills   Number of children: 2   Years of education: 6   Highest education level: Not on file  Occupational History   Occupation: Housewife  Tobacco Use   Smoking status: Never Smoker   Smokeless tobacco: Never Used  Scientific laboratory technician Use: Never used  Substance and Sexual Activity   Alcohol use: No   Drug use: No   Sexual activity: Not on file  Other Topics Concern   Not on file  Social History Narrative   Lives with her female partner and 2 sons.   Social Determinants of Health   Financial Resource Strain:    Difficulty of Paying Living Expenses: Not on file  Food Insecurity: No Food Insecurity   Worried About Charity fundraiser in the Last Year: Never true   Ran Out of Food in the Last Year: Never true  Transportation Needs: No Transportation Needs   Lack of Transportation (Medical): No   Lack of Transportation (Non-Medical):  No  Physical Activity:    Days of Exercise per Week: Not on file   Minutes of Exercise per Session: Not on file  Stress:    Feeling of Stress : Not on file  Social Connections:    Frequency of Communication with Friends and Family: Not on file   Frequency of Social Gatherings with Friends and Family: Not on file   Attends Religious Services: Not on file   Active Member of Clubs or Organizations: Not on file   Attends Archivist Meetings: Not on file   Marital Status: Not on file  Intimate Partner Violence: Not At Risk   Fear of Current or Ex-Partner: No   Emotionally Abused: No   Physically Abused: No   Sexually Abused: No     Review of Systems  Constitutional:  Negative for fatigue.  HENT:  Negative for dental problem.   Eyes:  Negative for  visual disturbance.  Respiratory:  Negative for cough and shortness of breath.   Cardiovascular:  Negative for chest pain, palpitations and leg swelling.  Gastrointestinal:  Negative for abdominal pain, blood in stool, constipation and diarrhea.  Musculoskeletal:  Positive for back pain (Low back pain--improves with stretching.).  Neurological:  Positive for dizziness (When stands from lying down upon arising in the morning., feels dizzy at times.  ). Negative for weakness and numbness.  Psychiatric/Behavioral:  Negative for dysphoric mood. The patient is nervous/anxious (Sometimes feels anxiety.  Helps to do something physically active.).       Objective:   BP 135/82 (BP Location: Left Arm, Patient Position: Sitting, Cuff Size: Normal)   Pulse 79   Resp 12   Ht 4' 9.25" (1.454 m)   Wt 132 lb 8 oz (60.1 kg)   LMP  (LMP Unknown)   BMI 28.42 kg/m   Physical Exam HENT:     Head: Normocephalic and atraumatic.     Right Ear: Tympanic membrane, ear canal and external ear normal.     Left Ear: Tympanic membrane, ear canal and external ear normal.     Nose: Nose normal.     Mouth/Throat:     Mouth: Mucous membranes are moist.     Pharynx: Oropharynx is clear.  Eyes:     Extraocular Movements: Extraocular movements intact.     Conjunctiva/sclera: Conjunctivae normal.     Pupils: Pupils are equal, round, and reactive to light.     Comments: Discs sharp bilaterally.  Neck:     Thyroid: No thyroid mass or thyromegaly.  Cardiovascular:     Rate and Rhythm: Normal rate and regular rhythm.     Heart sounds: S1 normal and S2 normal. No murmur heard.   No friction rub. No S3 or S4 sounds.     Comments: No carotid bruits.  Carotid, radial, femoral, DP and PT pulses normal and equal.    Pulmonary:     Effort: Pulmonary effort is normal.     Breath sounds: Normal breath sounds.  Chest:  Breasts:    Right: No mass, nipple discharge, skin change or tenderness.     Left: No mass, nipple  discharge, skin change or tenderness.  Abdominal:     General: Bowel sounds are normal.     Palpations: Abdomen is soft. There is no hepatomegaly, splenomegaly or mass.     Tenderness: There is no abdominal tenderness.     Hernia: No hernia is present.  Genitourinary:    Comments: Normal external female genitalia No adnexal mass  or tenderness. Musculoskeletal:        General: Normal range of motion.     Cervical back: Normal range of motion and neck supple.     Right lower leg: No edema.     Left lower leg: No edema.  Skin:    General: Skin is warm.     Capillary Refill: Capillary refill takes less than 2 seconds.     Comments: Small dry papular lesions on upper outer arms, some with mildly erythematous bases.  Neurological:     General: No focal deficit present.     Mental Status: She is alert and oriented to person, place, and time.     Deep Tendon Reflexes: Reflexes are normal and symmetric.  Psychiatric:        Mood and Affect: Mood normal.        Thought Content: Thought content normal.        Judgment: Judgment normal.     Assessment & Plan   CPE without pap Guaiac cards x 3 to return in 2 weeks. Schedule mammogram for Jan 2022. Influenza vaccine  2.  Need for dental care:  Dental referral.  3.  Keratosis pilaris of bilateral upper arms:  Eucerin Cream for eczema relief  4.  Hypercholesterolemia:  FLP in 3 months.  5.  Prediabetes:  stable.

## 2020-06-07 NOTE — Patient Instructions (Signed)
Eucerin cream for eczema relief after showering daily and perhaps one other time as well.  Return stool cards in 2 weeks.  Tome un vaso de agua antes de cada comida Tome un minimo de 6 a 8 vasos de agua diarios Coma tres veces al dia Coma una proteina y Ardelia Mems grasa saludable con comida.  (huevos, pescado, pollo, pavo, y limite carnes rojas Coma 5 porciones diarias de legumbres.  Mezcle los colores Coma 2 porciones diarias de frutas con cascara cuando sea comestible Use platos pequeos Suelte su tenedor o cuchara despues de cada mordida hata que se mastique y se trague Come en la mesa con amigos o familiares por lo menos una vez al dia [place] la televisin y aparatos electrnicos durante la comida  Su objetivo debe ser perder una libra por semana  Estudios recientes indican que las personas quienes consumen todos de sus calorias durante 12 horas se bajan de pesocon Mas eficiencia.  Por ejemplo, si Usted come su primera comida a las 7:00 a.m., su comida final del dia se debe completar antes de las 7:00 p.m.

## 2020-06-19 ENCOUNTER — Other Ambulatory Visit (INDEPENDENT_AMBULATORY_CARE_PROVIDER_SITE_OTHER): Payer: Self-pay

## 2020-06-19 DIAGNOSIS — Z1211 Encounter for screening for malignant neoplasm of colon: Secondary | ICD-10-CM

## 2020-06-19 LAB — POC HEMOCCULT BLD/STL (HOME/3-CARD/SCREEN)
Card #2 Fecal Occult Blod, POC: NEGATIVE
Card #3 Fecal Occult Blood, POC: NEGATIVE
Fecal Occult Blood, POC: NEGATIVE

## 2020-09-04 ENCOUNTER — Other Ambulatory Visit: Payer: Self-pay

## 2020-09-04 ENCOUNTER — Other Ambulatory Visit: Payer: Self-pay | Admitting: Internal Medicine

## 2020-09-04 DIAGNOSIS — E782 Mixed hyperlipidemia: Secondary | ICD-10-CM

## 2020-09-04 NOTE — Progress Notes (Signed)
Here for FLP Taking Omega 3 Fatty Acid 1 cap twice daily (1000 mg twice daily)

## 2020-09-05 LAB — LIPID PANEL W/O CHOL/HDL RATIO
Cholesterol, Total: 317 mg/dL — ABNORMAL HIGH (ref 100–199)
HDL: 64 mg/dL (ref >39)
LDL Chol Calc (NIH): 219 mg/dL — ABNORMAL HIGH (ref 0–99)
Triglycerides: 181 mg/dL — ABNORMAL HIGH (ref 0–149)
VLDL Cholesterol Cal: 34 mg/dL (ref 5–40)

## 2020-09-07 ENCOUNTER — Other Ambulatory Visit: Payer: Self-pay

## 2020-09-21 MED ORDER — ATORVASTATIN CALCIUM 40 MG PO TABS: ORAL_TABLET | ORAL | 11 refills | Status: DC

## 2020-09-28 ENCOUNTER — Telehealth: Payer: Self-pay | Admitting: Internal Medicine

## 2020-09-28 NOTE — Telephone Encounter (Signed)
Patient requested her Rx atorvastatin (LIPITOR) 40 MG tablet  to be send to pharmacy at [place] [facility] because her [place] is not valid right now.

## 2020-09-29 NOTE — Telephone Encounter (Signed)
Patient confirmed that she checked with the [place] and her prescription is not there. Originally her RX was sent to [facility], but her orange card is expired and cannot obtain her medicine, so pt. Is requesting to move her prescription to [place] at [facility].

## 2020-10-05 MED ORDER — ATORVASTATIN CALCIUM 40 MG PO TABS: ORAL_TABLET | ORAL | 11 refills | Status: DC

## 2020-10-05 NOTE — Addendum Note (Signed)
Addended by: Marcelino Duster on: 10/05/2020 08:34 AM   Modules accepted: Orders

## 2020-10-06 NOTE — Telephone Encounter (Signed)
Patient was notified of her prescription change via phone call.

## 2020-11-06 ENCOUNTER — Other Ambulatory Visit: Payer: Self-pay

## 2020-11-06 ENCOUNTER — Other Ambulatory Visit (INDEPENDENT_AMBULATORY_CARE_PROVIDER_SITE_OTHER): Payer: Self-pay | Admitting: Internal Medicine

## 2020-11-06 DIAGNOSIS — E782 Mixed hyperlipidemia: Secondary | ICD-10-CM

## 2020-11-06 DIAGNOSIS — M545 Low back pain, unspecified: Secondary | ICD-10-CM

## 2020-11-06 DIAGNOSIS — Z79899 Other long term (current) drug therapy: Secondary | ICD-10-CM

## 2020-11-06 DIAGNOSIS — E7800 Pure hypercholesterolemia, unspecified: Secondary | ICD-10-CM

## 2020-11-06 NOTE — Addendum Note (Signed)
Addended by: Marcelino Duster on: 11/06/2020 10:54 AM   Modules accepted: Orders, Level of Service

## 2020-11-06 NOTE — Progress Notes (Signed)
Gives history that she did not start Atorvastatin until 2- 3 weeks ago.  Her orange card was expired with Rx subsequently sent to [facility].  She also stopped the Atorvastatin 3 days ago as she developed left lower back pain.  No improvement since stopping the medication.  She cannot recall any injury or overuse to cause the pain.  Cancelling labs:  To call progress report end of week.  To take Tylenol for pain.  No atorvastatin.  [place] labs for today.

## 2020-11-13 ENCOUNTER — Telehealth: Payer: Self-pay | Admitting: Internal Medicine

## 2020-11-13 NOTE — Telephone Encounter (Signed)
Patient called to let you know that she is feeling better since she stopped taking atorvastatin. She continue to have a bit of pain in the left lower back, but is not as bad as it was before.

## 2020-11-14 NOTE — Telephone Encounter (Signed)
Lets wait until her appt in July to decide if she should try another medication to get her cholesterol down.  Would like her to be without the pain when we consider something else.

## 2020-11-16 NOTE — Telephone Encounter (Signed)
Patient was notified.

## 2020-12-01 ENCOUNTER — Other Ambulatory Visit: Payer: Self-pay

## 2020-12-01 ENCOUNTER — Ambulatory Visit (INDEPENDENT_AMBULATORY_CARE_PROVIDER_SITE_OTHER): Payer: No Typology Code available for payment source

## 2020-12-01 ENCOUNTER — Encounter (HOSPITAL_COMMUNITY): Payer: Self-pay

## 2020-12-01 ENCOUNTER — Ambulatory Visit (HOSPITAL_COMMUNITY)
Admission: EM | Admit: 2020-12-01 | Discharge: 2020-12-01 | Disposition: A | Discharge status: Home / Self Care | Payer: Self-pay | Attending: Urgent Care | Admitting: Urgent Care

## 2020-12-01 DIAGNOSIS — H538 Other visual disturbances: Secondary | ICD-10-CM

## 2020-12-01 DIAGNOSIS — R5383 Other fatigue: Secondary | ICD-10-CM

## 2020-12-01 DIAGNOSIS — G8929 Other chronic pain: Secondary | ICD-10-CM

## 2020-12-01 DIAGNOSIS — I10 Essential (primary) hypertension: Secondary | ICD-10-CM

## 2020-12-01 DIAGNOSIS — M542 Cervicalgia: Secondary | ICD-10-CM

## 2020-12-01 DIAGNOSIS — M545 Low back pain, unspecified: Secondary | ICD-10-CM

## 2020-12-01 LAB — POCT URINALYSIS DIPSTICK, ED / UC
Bilirubin Urine: NEGATIVE
Glucose, UA: NEGATIVE mg/dL
Ketones, ur: NEGATIVE mg/dL
Leukocytes,Ua: NEGATIVE
Nitrite: NEGATIVE
Protein, ur: NEGATIVE mg/dL
Specific Gravity, Urine: 1.01 (ref 1.005–1.030)
Urobilinogen, UA: 0.2 mg/dL (ref 0.0–1.0)
pH: 7 (ref 5.0–8.0)

## 2020-12-01 MED ORDER — NAPROXEN 500 MG PO TABS: 500.0000 mg | ORAL_TABLET | Freq: Two times a day (BID) | ORAL | 0 refills | Status: DC

## 2020-12-01 MED ORDER — TIZANIDINE HCL 4 MG PO TABS: 4.0000 mg | ORAL_TABLET | Freq: Every day | ORAL | 0 refills | Status: DC

## 2020-12-01 NOTE — ED Notes (Signed)
Pt triaged with assistance of spanish interpreter on [place].

## 2020-12-01 NOTE — ED Triage Notes (Signed)
Pt for 3-4 months has been c/o low back pain, intermittent hot/cold chills. Pt now reports pain radiating to neck and fatigue. Denies urinary pain or frequency but endorses cloudy, darker urine. States she had increased back pain when she was prescribed atorvastatin so provider took pt off of this.  Pt also reports also feeling that her vision has been blurry but states has been ongoing for about 2-3 years and that it is related to her blood pressure.

## 2020-12-01 NOTE — ED Provider Notes (Addendum)
Stephanie Dougherty   MRN: [id number] DOB: 1970-08-29  Subjective:   Stephanie Dougherty is a 50 y.o. female presenting for 3 to 59-month history of persistent intermittent low back pain.  Patient states that she just started having neck pain in the past few days as well.  Feels that the pain in her back radiates down to her buttocks and also upper back.  Pain can worsen when she lays down.  Denies fall, trauma.  Patient primarily does household work.  Tries to hydrate well.  4-5 bottles of water a day.  She does notice that her urine is intermittently cloudy and darker at times.  No history of kidney stones.  She does have a PCP and to address some aches and pains was taken off of atorvastatin.  However her pain is continued.  Patient thinks the pains were there before as well.  Also reports 2 to 3-year history of intermittent blurred vision.  Thinks it might be related to her blood pressure.  Has never had an ophthalmologist evaluate her vision.  Patient is not a smoker, no drug use or alcohol use.  Does not have cycles anymore, had an abdominal hysterectomy.  No history of kidney disease.  No current facility-administered medications for this encounter.  Current Outpatient Medications:  .  amLODipine (NORVASC) 5 MG tablet, Take 1 tablet by mouth once daily, Disp: 90 tablet, Rfl: 1 .  Cholecalciferol (VITAMIN D3) 25 MCG (1000 UT) CAPS, Take by mouth daily., Disp: , Rfl:  .  Omega-3 Fatty Acids (OMEGA 3 500 PO), Take 1 capsule by mouth daily., Disp: , Rfl:  .  atorvastatin (LIPITOR) 40 MG tablet, 1/2 tab by mouth daily with evening meal., Disp: 15 tablet, Rfl: 11   Allergies  Allergen Reactions  . Losartan Palpitations    Past Medical History:  Diagnosis Date  . Hyperlipidemia   . Hypertension 2019  . Prediabetes      Past Surgical History:  Procedure Laterality Date  . ABDOMINAL HYSTERECTOMY     No oophorectomy; emergent hysterectomy for postpartum bleeding.  .  CHOLECYSTECTOMY  2001    Family History  Problem Relation Age of Onset  . Hypertension Mother   . Diabetes Father   . Hypertension Father   . Lung disease Sister   . Diabetes Brother   . Obesity Son   . Diabetes Brother   . Diabetes Brother   . Deep vein thrombosis Sister   . Obesity Son     Social History   Tobacco Use  . Smoking status: Never Smoker  . Smokeless tobacco: Never Used  Vaping Use  . Vaping Use: Never used  Substance Use Topics  . Alcohol use: No  . Drug use: No    ROS   Objective:   Vitals: BP (!) 155/78   Pulse 79   Temp 98.7 F (37.1 C)   Resp 16   LMP  (LMP Unknown)   SpO2 100%   Physical Exam Constitutional:      General: She is not in acute distress.    Appearance: Normal appearance. She is well-developed. She is not ill-appearing, toxic-appearing or diaphoretic.  HENT:     Head: Normocephalic and atraumatic.     Right Ear: External ear normal.     Left Ear: External ear normal.     Nose: Nose normal.     Mouth/Throat:     Mouth: Mucous membranes are moist.  Eyes:     General: No  scleral icterus.       Right eye: No discharge.        Left eye: No discharge.     Extraocular Movements: Extraocular movements intact.     Conjunctiva/sclera: Conjunctivae normal.     Pupils: Pupils are equal, round, and reactive to light.  Cardiovascular:     Rate and Rhythm: Normal rate and regular rhythm.     Pulses: Normal pulses.     Heart sounds: Normal heart sounds. No murmur heard. No friction rub. No gallop.   Pulmonary:     Effort: Pulmonary effort is normal. No respiratory distress.     Breath sounds: Normal breath sounds. No stridor. No wheezing, rhonchi or rales.  Musculoskeletal:     Comments: Full range of motion throughout.  Strength 5/5 for upper and lower extremities.  Patient ambulates without any assistance at expected pace.  No ecchymosis, swelling, lacerations or abrasions.  Patient does have paraspinal muscle tenderness along  the lumbar and lower cervical/upper thoracic regions back excluding the midline.   Skin:    General: Skin is warm and dry.     Findings: No rash.  Neurological:     Mental Status: She is alert and oriented to person, place, and time.     Motor: No weakness.     Coordination: Coordination normal.     Gait: Gait normal.     Deep Tendon Reflexes: Reflexes normal.  Psychiatric:        Mood and Affect: Mood normal.        Behavior: Behavior normal.        Thought Content: Thought content normal.        Judgment: Judgment normal.     Results for orders placed or performed during the hospital encounter of 12/01/20 (from the past 24 hour(s))  POC Urinalysis dipstick     Status: Abnormal   Collection Time: 12/01/20 10:37 AM  Result Value Ref Range   Glucose, UA NEGATIVE NEGATIVE mg/dL   Bilirubin Urine NEGATIVE NEGATIVE   Ketones, ur NEGATIVE NEGATIVE mg/dL   Specific Gravity, Urine 1.010 1.005 - 1.030   Hgb urine dipstick MODERATE (A) NEGATIVE   pH 7.0 5.0 - 8.0   Protein, ur NEGATIVE NEGATIVE mg/dL   Urobilinogen, UA 0.2 0.0 - 1.0 mg/dL   Nitrite NEGATIVE NEGATIVE   Leukocytes,Ua NEGATIVE NEGATIVE    DG Lumbar Spine Complete  Result Date: 12/01/2020 CLINICAL DATA:  Low back pain 3-4 months EXAM: LUMBAR SPINE - COMPLETE 4+ VIEW COMPARISON:  None. FINDINGS: There is no evidence of lumbar spine fracture. Alignment is normal. Intervertebral disc spaces are maintained. IMPRESSION: Negative. Electronically Signed   By: Franchot Gallo M.D.   On: 12/01/2020 11:07   Assessment and Plan :   PDMP not reviewed this encounter.  1. Chronic bilateral low back pain, unspecified whether sciatica present   2. Neck pain   3. Blurred vision   4. Other fatigue   5. Essential hypertension     Recommended supportive care for her chronic back pain including the use of naproxen, tizanidine, back care.  I did complete a review of her medical records from her family practice.  Follow-up with PCP for  further work-up.  Recommended she follow-up with an ophthalmologist for complete eye exam. Counseled patient on potential for adverse effects with medications prescribed/recommended today, ER and return-to-clinic precautions discussed, patient verbalized understanding.     Jaynee Eagles, [place] 12/01/20 1202

## 2021-01-10 ENCOUNTER — Telehealth: Payer: Self-pay | Admitting: Internal Medicine

## 2021-01-10 NOTE — Telephone Encounter (Signed)
Medication refill through Epic interface Rx pool  Patient also called requesting refills and states she has 3 pills left

## 2021-02-12 ENCOUNTER — Other Ambulatory Visit: Payer: Self-pay

## 2021-02-12 DIAGNOSIS — E782 Mixed hyperlipidemia: Secondary | ICD-10-CM

## 2021-02-13 LAB — LIPID PANEL W/O CHOL/HDL RATIO
Cholesterol, Total: 261 mg/dL — ABNORMAL HIGH (ref 100–199)
HDL: 59 mg/dL (ref >39)
LDL Chol Calc (NIH): 181 mg/dL — ABNORMAL HIGH (ref 0–99)
Triglycerides: 117 mg/dL (ref 0–149)
VLDL Cholesterol Cal: 21 mg/dL (ref 5–40)

## 2021-02-21 ENCOUNTER — Telehealth: Payer: Self-pay

## 2021-02-21 NOTE — Telephone Encounter (Signed)
Schedule her for an appt.

## 2021-02-21 NOTE — Telephone Encounter (Signed)
Pt called to report lower back pain and believe pain is from kidneys. Pain since March 2022 after starting atorvastatin and even after ending it. Visited urgent care, was informed that blood was in urine but nothing to worry about. Symptoms include lower back pain, fatigue, feels hot, dark urine, feels potential for pain when urinating, and pains are mostly at night/while laying on back. Would like an appointment.

## 2021-02-27 ENCOUNTER — Other Ambulatory Visit: Payer: Self-pay

## 2021-02-27 ENCOUNTER — Ambulatory Visit: Payer: Self-pay | Admitting: Internal Medicine

## 2021-02-27 ENCOUNTER — Encounter: Payer: Self-pay | Admitting: Internal Medicine

## 2021-02-27 VITALS — BP 142/92 | HR 72 | Resp 18 | Ht <= 58 in | Wt 131.0 lb

## 2021-02-27 DIAGNOSIS — G8929 Other chronic pain: Secondary | ICD-10-CM

## 2021-02-27 DIAGNOSIS — R3129 Other microscopic hematuria: Secondary | ICD-10-CM

## 2021-02-27 DIAGNOSIS — Z9189 Other specified personal risk factors, not elsewhere classified: Secondary | ICD-10-CM

## 2021-02-27 LAB — POCT URINALYSIS DIPSTICK
Bilirubin, UA: NEGATIVE
Blood, UA: NEGATIVE
Glucose, UA: NEGATIVE
Ketones, UA: NEGATIVE
Leukocytes, UA: NEGATIVE
Nitrite, UA: NEGATIVE
Protein, UA: NEGATIVE
Spec Grav, UA: 1.015 (ref 1.010–1.025)
Urobilinogen, UA: 0.2 E.U./dL
pH, UA: 6.5 (ref 5.0–8.0)

## 2021-02-27 NOTE — Progress Notes (Unsigned)
    Subjective:    Patient ID: Stephanie Dougherty, female   DOB: 11-09-1970, 50 y.o.   MRN: [id number]   HPI  Interpreted by Karen Kays   Low back pain:  Comes and goes.  Worse at night and when lying prone.  Discomfort started about 1 month before initiated Atorvastatin.  She feels it worsened as started the Atorvastatin as well as she became more physically active on a regular basis.  Does feel the pain decreased when stopped the Atorvastatin and worsened when she restarted (stopped and started twice).  Also, felt it affected her urination.  Urine color would change or be foamy.  Describes her urine being "hot" and also some pressure from bladder.      Was seen 12/01/2020 in ED/urgent care for low back pain.  L/s spine was normal, but she did have blood in urine.  She has had a hysterectomy, so no periods.  She states she was told there was nothing wrong with her urine.    Sometimes when has the low back pain, she feels feverish, but when takes temp--no fever.    No history of acute or chronic repetitive injury to her back.  When she is more physically active, her back discomfort is less.  Does perform stretches and low level weights as seems to help.     2.  Dryness around mouth.  States has had eczema in the perioral area before.  Hurts and burns at the lateral canthi of lips.    3.  Hyperlipidemia:  muscle pain to Atorvastatin.  Discussed will need treatment at some pointl   Current Meds  Medication Sig   amLODipine (NORVASC) 5 MG tablet Take 1 tablet by mouth once daily   Cholecalciferol (VITAMIN D3) 25 MCG (1000 UT) CAPS Take by mouth daily.   Omega-3 Fatty Acids (OMEGA 3 500 PO) Take 1 capsule by mouth daily.   Allergies  Allergen Reactions   Losartan Palpitations     Review of Systems    Objective:   BP (!) 142/92 (BP Location: Right Arm, Patient Position: Sitting, Cuff Size: Normal)   Pulse 72   Resp 18   Ht 4' 9.25" (1.454 m)   Wt 131 lb (59.4 kg)   LMP   (LMP Unknown)   BMI 28.10 kg/m   Physical Exam  Tongue appears normal  Assessment & Plan    Low back pain  2.  Microscopic hematuria  3.  Angular chelosis, mild:  Lotrimin 1% twice daily to area and take Vitamin B complex daily

## 2021-02-27 NOTE — Telephone Encounter (Signed)
Pt scheduled for 7/26

## 2021-03-02 ENCOUNTER — Other Ambulatory Visit: Payer: Self-pay

## 2021-03-02 DIAGNOSIS — Z79899 Other long term (current) drug therapy: Secondary | ICD-10-CM

## 2021-03-03 LAB — COMPREHENSIVE METABOLIC PANEL
ALT: 26 IU/L (ref 0–32)
AST: 24 IU/L (ref 0–40)
Albumin/Globulin Ratio: 1.8 (ref 1.2–2.2)
Albumin: 4.5 g/dL (ref 3.8–4.8)
Alkaline Phosphatase: 53 IU/L (ref 44–121)
BUN/Creatinine Ratio: 13 (ref 9–23)
BUN: 7 mg/dL (ref 6–24)
Bilirubin Total: 0.6 mg/dL (ref 0.0–1.2)
CO2: 20 mmol/L (ref 20–29)
Calcium: 8.5 mg/dL — ABNORMAL LOW (ref 8.7–10.2)
Chloride: 99 mmol/L (ref 96–106)
Creatinine, Ser: 0.52 mg/dL — ABNORMAL LOW (ref 0.57–1.00)
Globulin, Total: 2.5 g/dL (ref 1.5–4.5)
Glucose: 83 mg/dL (ref 65–99)
Potassium: 3.5 mmol/L (ref 3.5–5.2)
Sodium: 139 mmol/L (ref 134–144)
Total Protein: 7 g/dL (ref 6.0–8.5)
eGFR: 115 mL/min/{1.73_m2} (ref >59)

## 2021-03-03 LAB — CBC WITH DIFFERENTIAL/PLATELET
Basophils Absolute: 0 10*3/uL (ref 0.0–0.2)
Basos: 1 %
EOS (ABSOLUTE): 0.1 10*3/uL (ref 0.0–0.4)
Eos: 2 %
Hematocrit: 39.7 % (ref 34.0–46.6)
Hemoglobin: 13.2 g/dL (ref 11.1–15.9)
Immature Grans (Abs): 0 10*3/uL (ref 0.0–0.1)
Immature Granulocytes: 0 %
Lymphocytes Absolute: 2 10*3/uL (ref 0.7–3.1)
Lymphs: 42 %
MCH: 31 pg (ref 26.6–33.0)
MCHC: 33.2 g/dL (ref 31.5–35.7)
MCV: 93 fL (ref 79–97)
Monocytes Absolute: 0.4 10*3/uL (ref 0.1–0.9)
Monocytes: 8 %
Neutrophils Absolute: 2.2 10*3/uL (ref 1.4–7.0)
Neutrophils: 47 %
Platelets: 311 10*3/uL (ref 150–450)
RBC: 4.26 x10E6/uL (ref 3.77–5.28)
RDW: 12.1 % (ref 11.7–15.4)
WBC: 4.7 10*3/uL (ref 3.4–10.8)

## 2021-06-11 ENCOUNTER — Other Ambulatory Visit: Payer: Self-pay | Admitting: Internal Medicine

## 2021-06-14 ENCOUNTER — Encounter: Payer: Self-pay | Admitting: Internal Medicine

## 2021-08-27 ENCOUNTER — Other Ambulatory Visit: Payer: Self-pay

## 2021-09-04 ENCOUNTER — Encounter: Payer: Self-pay | Admitting: Internal Medicine

## 2022-01-10 ENCOUNTER — Encounter: Payer: Self-pay | Admitting: Physician Assistant

## 2022-01-31 NOTE — Progress Notes (Signed)
02/04/2022 Palak Tercero [id number] [birth year]  Referring provider: Mack Hook, MD Primary GI doctor: Dr. Luvenia Starch  ASSESSMENT AND PLAN:   Dysphagia, unspecified type with GERD EGD to evaluate for structural abnormality, tumor, erosive/infectious esophagititis, and EOE, gastritis, PUD, H pylori Some dysphagia prior to ABX, some after, no odynophagia or visible plaques on exam today, possible will need to rule out yeast esophagitis If the EGD is negative can then proceed to barium swallow and/or esophageal manometry. Can do trial of PPI once daily I discussed risks of EGD with patient today, including risk of sedation, bleeding or perforation.  Patient provides understanding and gave verbal consent to proceed. Patient is self pay, will not get labs today -     pantoprazole (PROTONIX) 40 MG tablet; Take 1 tablet (40 mg total) by mouth daily.  Chronic idiopathic constipation - Increase fiber/ water intake, decrease caffeine, increase activity level. -Will add on Benefiber  Screen for colon cancer We have discussed the risks of bleeding, infection, perforation, medication reactions, and remote risk of death associated with colonoscopy. All questions were answered and the patient acknowledges these risk and wishes to proceed.     Patient Care Team: Mack Hook, MD as PCP - General (Internal Medicine)  HISTORY OF PRESENT ILLNESS: 51 y.o. female with a past medical history of hypertension, hyperlipidemia, prediabetes, status post hysterectomy/ cholecystectomy and others listed below presents for evaluation of GERD.   03/02/2021 CBC without leukocytosis or anemia.  Kidney and liver function normal.  Patient having trouble swallowing with liquids and solids for several months but this has improved, she complains now of a burning sensation and a lot of mucus.  No odynophagia.  When she drinks something with acid, she has irritation in her throat.   Complaining of sores on her tongue and side of her mouth. She took ABX in May 15th for 2 weeks for sinus infection, cough- that is when her throat got worse. An have some epigastric pain but massage helped. Denies nausea or vomiting.  Has intermittent diarrhea and constipation for 2 years.  Metamucil helped but gave her GERD When she sits for a long time she has constipation and has bloating but no AB pain.  Can have green/ yellow stool, can have small pieces of soft stool every 2-3 days. No blood in stool no black stool.  Had labs 10-30 days ago with CBC which were normal  She has family history of celiac disease in her brother.  She denies ETOH, smoking, drug use.  *Due to language barrier, an interpreter was present during the history-taking and subsequent discussion (and for part of the physical exam) with this patient.   Current Medications:    Current Outpatient Medications (Cardiovascular):    amLODipine (NORVASC) 5 MG tablet, Take 1 tablet by mouth once daily   atorvastatin (LIPITOR) 40 MG tablet, 1/2 tab by mouth daily with evening meal.     Current Outpatient Medications (Other):    Cholecalciferol (VITAMIN D3) 25 MCG (1000 UT) CAPS, Take by mouth daily.   Omega-3 Fatty Acids (OMEGA 3 500 PO), Take 1 capsule by mouth daily.   pantoprazole (PROTONIX) 40 MG tablet, Take 1 tablet (40 mg total) by mouth daily.  Medical History:  Past Medical History:  Diagnosis Date   Hyperlipidemia    Hypertension 2019   Prediabetes    Allergies:  Allergies  Allergen Reactions   Losartan Palpitations     Surgical History:  She  has a past surgical  history that includes Abdominal hysterectomy and Cholecystectomy (2001). Family History:  Her family history includes Celiac disease in her brother; Deep vein thrombosis in her sister; Diabetes in her brother, brother, brother, and father; Hiatal hernia in her brother; Hypertension in her father and mother; Lung disease in her sister;  Obesity in her son and son. Social History:   reports that she has never smoked. She has never used smokeless tobacco. She reports that she does not drink alcohol and does not use drugs.  REVIEW OF SYSTEMS  : All other systems reviewed and negative except where noted in the History of Present Illness.   PHYSICAL EXAM: BP (!) 156/100   Pulse 75   Ht '4\' 9"'$  (1.448 m)   Wt 135 lb (61.2 kg)   LMP  (LMP Unknown)   BMI 29.21 kg/m  General:   Pleasant, well developed female in no acute distress Head:   Normocephalic and atraumatic.No lymphadenopathy, no white plaques in mouth/tongue or postpharynx.  Eyes:  sclerae anicteric,conjunctive pink  Heart:   regular rate and rhythm Pulm:  Clear anteriorly; no wheezing Abdomen:   Soft, Obese AB, Active bowel sounds. No tenderness . Without guarding and Without rebound, No organomegaly appreciated. Rectal: Not evaluated Extremities:  Without edema. Msk: Symmetrical without gross deformities. Peripheral pulses intact.  Neurologic:  Alert and  oriented x4;  No focal deficits.  Skin:   Dry and intact without significant lesions or rashes. Psychiatric:  Cooperative. Normal mood and affect.    Vladimir Crofts, PA-C 9:05 AM

## 2022-02-04 ENCOUNTER — Encounter: Payer: Self-pay | Admitting: Physician Assistant

## 2022-02-04 ENCOUNTER — Ambulatory Visit (INDEPENDENT_AMBULATORY_CARE_PROVIDER_SITE_OTHER): Payer: Self-pay | Admitting: Physician Assistant

## 2022-02-04 ENCOUNTER — Other Ambulatory Visit: Payer: Self-pay

## 2022-02-04 VITALS — BP 156/100 | HR 75 | Ht <= 58 in | Wt 135.0 lb

## 2022-02-04 DIAGNOSIS — K2100 Gastro-esophageal reflux disease with esophagitis, without bleeding: Secondary | ICD-10-CM

## 2022-02-04 DIAGNOSIS — R1310 Dysphagia, unspecified: Secondary | ICD-10-CM

## 2022-02-04 DIAGNOSIS — K5904 Chronic idiopathic constipation: Secondary | ICD-10-CM

## 2022-02-04 DIAGNOSIS — Z1211 Encounter for screening for malignant neoplasm of colon: Secondary | ICD-10-CM

## 2022-02-04 MED ORDER — PANTOPRAZOLE SODIUM 40 MG PO TBEC: 40.0000 mg | DELAYED_RELEASE_TABLET | Freq: Every day | ORAL | 3 refills | Status: DC

## 2022-02-04 NOTE — Patient Instructions (Addendum)
Add on fiber  Enfermedad de reflujo gastroesofgico en los adultos Gastroesophageal Reflux Disease, Adult  El reflujo gastroesofgico (RGE) ocurre cuando el cido del estmago sube por el tubo que conecta la boca con el estmago (esfago). Normalmente, la comida baja por el esfago y se mantiene en el estmago, donde se la digiere. Cuando una persona tiene RGE, los alimentos y el cido estomacal suelen volver al esfago. Usted puede tener una enfermedad llamada enfermedad de reflujo gastroesofgico (ERGE) si el reflujo: Sucede a menudo. Causa sntomas frecuentes o muy intensos. Causa problemas tales como dao en el esfago. Cuando esto ocurre, el esfago duele y se hincha. Con el tiempo, la ERGE puede ocasionar pequeos agujeros (lceras) en el revestimiento del esfago. Cules son las causas? Esta afeccin se debe a un problema en el msculo que se encuentra entre el esfago y [place]. Cuando este msculo est dbil o no es normal, no se cierra correctamente para impedir que los alimentos y el cido regresen del Paramedic. El msculo puede debilitarse debido a lo siguiente: El consumo de [place]. [place]. Tener cierto tipo de hernia (hernia de hiato). Consumo de alcohol. Ciertos alimentos y bebidas, como caf, chocolate, cebollas y [place]. Qu incrementa el riesgo? Tener sobrepeso. Tener una enfermedad que afecta el tejido conjuntivo. Tomar antiinflamatorios no esteroideos (AINE), como el ibuprofeno. Cules son los signos o sntomas? Acidez estomacal. Dificultad o dolor al tragar. Sensacin de Best boy un bulto en la garganta. Sabor amargo en la boca. Mal aliento. Tener una gran cantidad de saliva. Estmago inflamado o con Tree surgeon. Eructos. Dolor en el pecho. El dolor de pecho puede deberse a distintas afecciones. Asegrese de Teacher, adult education a su mdico si tiene Tourist information centre manager. Dificultad para respirar o sibilancias. Ardelia Mems tos a largo plazo o tos nocturna. Desgaste de la superficie  de los dientes (esmalte dental). Prdida de peso. Cmo se trata? Realizar cambios en la dieta. Tomar medicamentos. Someterse a [place]. El tratamiento depender de la gravedad de los sntomas. Siga estas instrucciones en su casa: Comida y bebida  Siga una dieta como se lo haya indicado el mdico. Es posible que deba evitar alimentos y bebidas, por ejemplo: Caf y t negro, con o sin cafena. Bebidas que contengan alcohol. Bebidas energticas y deportivas. Bebidas gaseosas (carbonatadas) y refrescos. Chocolate y cacao. Menta y [place]. Ajo y cebolla. Rbano picante. Alimentos cidos y condimentados. Estos incluyen todos los tipos de pimientos, [place] en polvo, curry en polvo, vinagre, salsas picantes y [employer]. Ctricos y sus jugos, por ejemplo, naranjas, limones y limas. Alimentos que [employer]. Estos incluyen salsa roja, [place], salsa picante y pizza con salsa de [place]. Alimentos fritos y Radio broadcast assistant. Estos incluyen donas, papas fritas, papitas fritas de bolsa y aderezos con alto contenido de [place]. Carnes con alto contenido de [place]. Estas incluye los perros calientes, chuletas o costillas, embutidos, jamn y tocino. Productos lcteos ricos en grasas, como leche [place], [place] y Victor crema. Consuma pequeas cantidades de comida con ms frecuencia. Evite consumir porciones abundantes. Evite beber grandes cantidades de lquidos con las comidas. Evite comer 2 o 3 horas antes de acostarse. Evite recostarse inmediatamente despus de comer. No haga ejercicios enseguida despus de comer. Estilo de vida  No fume ni consuma ningn producto que contenga nicotina o tabaco. Si necesita ayuda para dejar de consumir estos productos, consulte al mdico. Intente reducir el nivel de estrs. Si necesita ayuda para hacer esto, consulte al mdico. Si tiene sobrepeso, baje una cantidad de peso saludable  para usted. Consulte a su mdico para bajar de peso de [employer]. Instrucciones generales Est atento a cualquier cambio en los sntomas. Tome los medicamentos de venta libre y los recetados solamente como se lo haya indicado el mdico. No tome aspirina, ibuprofeno ni otros antiinflamatorios no esteroideos (AINE) a menos que el mdico lo autorice. Use ropa holgada. No use nada apretado alrededor de la cintura. Levante (eleve) la cabecera de la cama aproximadamente 6 pulgadas (15 cm). Para hacerlo, es posible que tenga que utilizar una cua. Evite inclinarse si al hacerlo empeoran los sntomas. Cumpla con todas las visitas de seguimiento. Comunquese con un mdico si: Aparecen nuevos sntomas. Adelgaza y no sabe por qu. Tiene problemas para tragar o le duele cuando traga. Tiene sibilancias o tos persistente. Tiene la voz ronca. Los sntomas no mejoran con Dispensing optician. Solicite ayuda de inmediato si: Tree surgeon repentino [employer], el cuello, la [place], los dientes o la espalda. De repente se siente transpirado, mareado o aturdido. Siente falta de aire o Tourist information centre manager. Vomita y el vmito es de color verde, amarillo o negro, o tiene un aspecto similar a la sangre o a los posos de caf. Se desmaya. Las heces (deposiciones) son rojas, sanguinolentas o negras. No puede tragar, beber o comer. Estos sntomas pueden representar un problema grave que constituye Engineer, maintenance (IT). No espere a ver si los sntomas desaparecen. Solicite atencin mdica de inmediato. Comunquese con el servicio de emergencias de su localidad (911 en los [place]). No conduzca por sus propios medios [employer] Financial. Resumen Si una persona tiene enfermedad de reflujo gastroesofgico (ERGE), los alimentos y el cido estomacal suben al esfago y causan sntomas o problemas tales como dao en el esfago. El tratamiento depender de la gravedad de los sntomas. Siga una dieta como se lo haya indicado el mdico. Use todos los medicamentos solamente como se lo  haya indicado el mdico. Esta informacin no tiene Marine scientist el consejo del mdico. Asegrese de hacerle al mdico cualquier pregunta que tenga. Document Revised: 03/03/2020 Document Reviewed: 03/03/2020 Elsevier Patient Education  [place].   If you are age 80 or older, your body mass index should be between 23-30. Your Body mass index is 29.21 kg/m. If this is out of the aforementioned range listed, please consider follow up with your Primary Care Provider.  If you are age 57 or younger, your body mass index should be between 19-25. Your Body mass index is 29.21 kg/m. If this is out of the aformentioned range listed, please consider follow up with your Primary Care Provider.   ________________________________________________________  The [place] [facility] providers would like to encourage you to use [facility] to communicate with providers for non-urgent requests or questions.  Due to long hold times on the telephone, sending your provider a message by [facility] may be a faster and more efficient way to get a response.  Please allow 48 business hours for a response.  Please remember that this is for non-urgent requests.  _______________________________________________________   Due to recent changes in healthcare laws, you may see the results of your imaging and laboratory studies on MyChart before your provider has had a chance to review them.  We understand that in some cases there may be results that are confusing or concerning to you. Not all laboratory results come back in the same time frame and the provider may be waiting for multiple results in order to interpret others.  Please give [place]  48 hours in order for your provider to thoroughly review all the results before contacting the office for clarification of your results.    I appreciate the  opportunity to care for you  Thank You   [facility]

## 2022-03-05 ENCOUNTER — Encounter: Payer: Self-pay | Admitting: Gastroenterology

## 2022-03-05 ENCOUNTER — Ambulatory Visit (AMBULATORY_SURGERY_CENTER): Payer: Self-pay | Admitting: Gastroenterology

## 2022-03-05 VITALS — BP 126/48 | HR 66 | Temp 98.7°F | Resp 12 | Ht <= 58 in | Wt 135.0 lb

## 2022-03-05 DIAGNOSIS — K319 Disease of stomach and duodenum, unspecified: Secondary | ICD-10-CM

## 2022-03-05 DIAGNOSIS — K219 Gastro-esophageal reflux disease without esophagitis: Secondary | ICD-10-CM

## 2022-03-05 DIAGNOSIS — D122 Benign neoplasm of ascending colon: Secondary | ICD-10-CM

## 2022-03-05 DIAGNOSIS — Z1211 Encounter for screening for malignant neoplasm of colon: Secondary | ICD-10-CM

## 2022-03-05 DIAGNOSIS — K317 Polyp of stomach and duodenum: Secondary | ICD-10-CM

## 2022-03-05 DIAGNOSIS — D120 Benign neoplasm of cecum: Secondary | ICD-10-CM

## 2022-03-05 DIAGNOSIS — D123 Benign neoplasm of transverse colon: Secondary | ICD-10-CM

## 2022-03-05 DIAGNOSIS — D130 Benign neoplasm of esophagus: Secondary | ICD-10-CM

## 2022-03-05 DIAGNOSIS — R1310 Dysphagia, unspecified: Secondary | ICD-10-CM

## 2022-03-05 MED ORDER — SODIUM CHLORIDE 0.9 % IV SOLN: 500.0000 mL | Freq: Once | INTRAVENOUS | Status: DC

## 2022-03-05 NOTE — Progress Notes (Signed)
Vs by CW in adm   Pt's states no medical or surgical changes since previsit or office visit.   Interpreter , Raquel, with pt during adm

## 2022-03-05 NOTE — Progress Notes (Signed)
Called to room to assist during endoscopic procedure.  Patient ID and intended procedure confirmed with present staff. Received instructions for my participation in the procedure from the performing physician.  

## 2022-03-05 NOTE — Progress Notes (Signed)
History and Physical Interval Note: See clinic note 02/04/22 for details. NO significant interval changes. Dysphagia improved with protonix since the clinic visit but she still have some pyrosis and burning in her throat despite protonix. EGD to evaluate that. Colonoscopy for screening purposes. She wishes to proceed after discussion of risks / benefits. Denies any cardiopulmonary symptoms.  03/05/2022 2:29 PM  [place]  has presented today for endoscopic procedure(s), with the diagnosis of  Encounter Diagnoses  Name Primary?   Dysphagia, unspecified type Yes   Gastroesophageal reflux disease, unspecified whether esophagitis present    Colon cancer screening   .  The various methods of evaluation and treatment have been discussed with the patient and/or family. After consideration of risks, benefits and other options for treatment, the patient has consented to  the endoscopic procedure(s).   The patient's history has been reviewed, patient examined, no change in status, stable for surgery.  I have reviewed the patient's chart and labs.  Questions were answered to the patient's satisfaction.    Jolly Mango, MD [facility]

## 2022-03-05 NOTE — Op Note (Signed)
[facility] Patient Name: Stephanie Dougherty Procedure Date: 03/05/2022 2:16 PM MRN: [id number] Endoscopist: Remo Lipps P. Havery Moros , MD Age: 51 Referring MD:  Date of Birth: 1971-04-14 Gender: Female Account #: [id number] Procedure:                Colonoscopy Indications:              Screening for colorectal malignant neoplasm, This                            is the patient's first colonoscopy Medicines:                Monitored Anesthesia Care Procedure:                Pre-Anesthesia Assessment:                           - Prior to the procedure, a History and Physical                            was performed, and patient medications and                            allergies were reviewed. The patient's tolerance of                            previous anesthesia was also reviewed. The risks                            and benefits of the procedure and the sedation                            options and risks were discussed with the patient.                            All questions were answered, and informed consent                            was obtained. Prior Anticoagulants: The patient has                            taken no previous anticoagulant or antiplatelet                            agents. ASA Grade Assessment: II - A patient with                            mild systemic disease. After reviewing the risks                            and benefits, the patient was deemed in                            satisfactory condition to undergo the procedure.  After obtaining informed consent, the colonoscope                            was passed under direct vision. Throughout the                            procedure, the patient's blood pressure, pulse, and                            oxygen saturations were monitored continuously. The                            Olympus PCF-H190DL (#[id number]) Colonoscope was                            introduced  through the anus and advanced to the the                            cecum, identified by appendiceal orifice and                            ileocecal valve. The colonoscopy was performed                            without difficulty. The patient tolerated the                            procedure well. The quality of the bowel                            preparation was good. The ileocecal valve,                            appendiceal orifice, and rectum were photographed. Scope In: 2:42:06 PM Scope Out: 3:00:37 PM Scope Withdrawal Time: 0 hours 15 minutes 19 seconds  Total Procedure Duration: 0 hours 18 minutes 31 seconds  Findings:                 The perianal and digital rectal examinations were                            normal.                           A 3 mm polyp was found in the cecum. The polyp was                            sessile. The polyp was removed with a cold snare.                            Resection and retrieval were complete.                           Two sessile polyps were found in the ascending  colon. The polyps were 3 to 6 mm in size. These                            polyps were removed with a cold snare. Resection                            and retrieval were complete.                           A 3 to 4 mm polyp was found in the transverse                            colon. The polyp was sessile. The polyp was removed                            with a cold snare. Resection and retrieval were                            complete.                           Internal hemorrhoids were found during                            retroflexion. The hemorrhoids were small.                           The exam was otherwise without abnormality. Complications:            No immediate complications. Estimated blood loss:                            Minimal. Estimated Blood Loss:     Estimated blood loss was minimal. Impression:               - One 3 mm  polyp in the cecum, removed with a cold                            snare. Resected and retrieved.                           - Two 3 to 6 mm polyps in the ascending colon,                            removed with a cold snare. Resected and retrieved.                           - One 3 to 4 mm polyp in the transverse colon,                            removed with a cold snare. Resected and retrieved.                           - Internal hemorrhoids.                           -  The examination was otherwise normal. Recommendation:           - Patient has a contact number available for                            emergencies. The signs and symptoms of potential                            delayed complications were discussed with the                            patient. Return to normal activities tomorrow.                            Written discharge instructions were provided to the                            patient.                           - Resume previous diet.                           - Continue present medications.                           - Await pathology results. Remo Lipps P. Jera Headings, MD 03/05/2022 3:08:02 PM This report has been signed electronically.

## 2022-03-05 NOTE — Op Note (Signed)
[facility] Patient Name: Stephanie Dougherty Procedure Date: 03/05/2022 2:16 PM MRN: [id number] Endoscopist: Remo Lipps P. Havery Moros , MD Age: 51 Referring MD:  Date of Birth: 1970/11/18 Gender: Female Account #: [id number] Procedure:                Upper GI endoscopy Indications:              Dysphagia, Follow-up of gastro-esophageal reflux                            disease - dysphagia improved on protonix '40mg'$  / day                            but pyrosis / throat discomfort has persisted Medicines:                Monitored Anesthesia Care Procedure:                Pre-Anesthesia Assessment:                           - Prior to the procedure, a History and Physical                            was performed, and patient medications and                            allergies were reviewed. The patient's tolerance of                            previous anesthesia was also reviewed. The risks                            and benefits of the procedure and the sedation                            options and risks were discussed with the patient.                            All questions were answered, and informed consent                            was obtained. Prior Anticoagulants: The patient has                            taken no previous anticoagulant or antiplatelet                            agents. ASA Grade Assessment: II - A patient with                            mild systemic disease. After reviewing the risks                            and benefits, the patient was deemed in  satisfactory condition to undergo the procedure.                           After obtaining informed consent, the endoscope was                            passed under direct vision. Throughout the                            procedure, the patient's blood pressure, pulse, and                            oxygen saturations were monitored continuously. The                             Endoscope was introduced through the mouth, and                            advanced to the second part of duodenum. The upper                            GI endoscopy was accomplished without difficulty.                            The patient tolerated the procedure well. Scope In: Scope Out: Findings:                 Esophagogastric landmarks were identified: the                            Z-line was found at 35 cm, the gastroesophageal                            junction was found at 35 cm and the upper extent of                            the gastric folds was found at 35 cm from the                            incisors.                           A single diminutive polyp was found 25 cm from the                            incisors, likely squamous papilloma. The polyp was                            removed with a cold biopsy forceps. Resection and                            retrieval were complete.  The exam of the esophagus was otherwise normal. No                            inflammatory changes - no focal stenosis /                            stricture. No dilation performed given dysphagia                            resolved.                           Biopsies were taken with a cold forceps in the                            upper third of the esophagus, in the middle third                            of the esophagus and in the lower third of the                            esophagus for histology to rule out EoE.                           A single 3 to 4 mm sessile polyp was found at the                            pylorus. The polyp was removed with a cold biopsy                            forceps. Resection and retrieval were complete.                           The exam of the stomach was otherwise normal.                           The examined duodenum was normal. Complications:            No immediate complications. Estimated blood loss:                             Minimal. Estimated Blood Loss:     Estimated blood loss was minimal. Impression:               - Esophagogastric landmarks identified.                           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 Resected and retrieved.                           - Normal esophagus otherwise - no focal stenosis  noted, no inflammatory changes noted. Biopsies                            taken to rule out EoE                           - A single gastric polyp. Resected and retrieved.                           - Normal stomach otherwise                           - Normal examined duodenum. Recommendation:           - Patient has a contact number available for                            emergencies. The signs and symptoms of potential                            delayed complications were discussed with the                            patient. Return to normal activities tomorrow.                            Written discharge instructions were provided to the                            patient.                           - Resume previous diet.                           - Continue present medications.                           - Await pathology results. Remo Lipps P. Zenia Guest, MD 03/05/2022 3:16:17 PM This report has been signed electronically.

## 2022-03-05 NOTE — Patient Instructions (Signed)
Discharge instructions given. Handouts on polyps,diverticulosis and hemorrhoids. Resume previous medications. YOU HAD AN ENDOSCOPIC PROCEDURE TODAY AT [facility]:   Refer to the procedure report that was given to you for any specific questions about what was found during the examination.  If the procedure report does not answer your questions, please call your gastroenterologist to clarify.  If you requested that your care partner not be given the details of your procedure findings, then the procedure report has been included in a sealed envelope for you to review at your convenience later.  YOU SHOULD EXPECT: Some feelings of bloating in the abdomen. Passage of more gas than usual.  Walking can help get rid of the air that was put into your GI tract during the procedure and reduce the bloating. If you had a lower endoscopy (such as a colonoscopy or flexible sigmoidoscopy) you may notice spotting of blood in your stool or on the toilet paper. If you underwent a bowel prep for your procedure, you may not have a normal bowel movement for a few days.  Please Note:  You might notice some irritation and congestion in your nose or some drainage.  This is from the oxygen used during your procedure.  There is no need for concern and it should clear up in a day or so.  SYMPTOMS TO REPORT IMMEDIATELY:  Following lower endoscopy (colonoscopy or flexible sigmoidoscopy):  Excessive amounts of blood in the stool  Significant tenderness or worsening of abdominal pains  Swelling of the abdomen that is new, acute  Fever of 100F or higher  Following upper endoscopy (EGD)  Vomiting of blood or coffee ground material  New chest pain or pain under the shoulder blades  Painful or persistently difficult swallowing  New shortness of breath  Fever of 100F or higher  Black, tarry-looking stools  For urgent or emergent issues, a gastroenterologist can be reached at any hour by calling [phone]  [phone]. Do not use MyChart messaging for urgent concerns.    DIET:  We do recommend a small meal at first, but then you may proceed to your regular diet.  Drink plenty of fluids but you should avoid alcoholic beverages for 24 hours.  ACTIVITY:  You should plan to take it easy for the rest of today and you should NOT DRIVE or use heavy machinery until tomorrow (because of the sedation medicines used during the test).    FOLLOW UP: Our staff will call the number listed on your records the next business day following your procedure.  We will call around 7:15- 8:00 am to check on you and address any questions or concerns that you may have regarding the information given to you following your procedure. If we do not reach you, we will leave a message.  If you develop any symptoms (ie: fever, flu-like symptoms, shortness of breath, cough etc.) before then, please call [phone].  If you test positive for Covid 19 in the 2 weeks post procedure, please call and report this information to [place].    If any biopsies were taken you will be contacted by phone or by letter within the next 1-3 weeks.  Please call us at [phone] if you have not heard about the biopsies in 3 weeks.    SIGNATURES/CONFIDENTIALITY: You and/or your care partner have signed paperwork which will be entered into your electronic medical record.  These signatures attest to the fact that that the information above on your After Visit Summary has  been reviewed and is understood.  Full responsibility of the confidentiality of this discharge information lies with you and/or your care-partner.  

## 2022-03-05 NOTE — Progress Notes (Signed)
VSS, transported to PACU °

## 2022-03-06 ENCOUNTER — Telehealth: Payer: Self-pay

## 2022-03-06 NOTE — Telephone Encounter (Signed)
  Follow up Call-     03/05/2022    1:38 PM  Call back number  Post procedure Call Back phone  # [phone]- son Bea Graff should be there and he speake English  Permission to leave phone message Yes     Patient questions:  Do you have a fever, pain , or abdominal swelling? No. Pain Score  0 *  Have you tolerated food without any problems? Yes.    Have you been able to return to your normal activities? Yes.    Do you have any questions about your discharge instructions: Diet   No. Medications  No. Follow up visit  No.  Do you have questions or concerns about your Care? No.  Actions: * If pain score is 4 or above: No action needed, pain <4. Information obtained using [employer], Mordecai Rasmussen was the interpreter used.

## 2022-03-08 ENCOUNTER — Encounter: Payer: Self-pay | Admitting: Gastroenterology

## 2022-04-30 ENCOUNTER — Encounter (HOSPITAL_COMMUNITY): Payer: Self-pay | Admitting: *Deleted

## 2022-04-30 ENCOUNTER — Emergency Department (HOSPITAL_COMMUNITY): Payer: Self-pay

## 2022-04-30 ENCOUNTER — Other Ambulatory Visit: Payer: Self-pay

## 2022-04-30 ENCOUNTER — Emergency Department (HOSPITAL_COMMUNITY)
Admission: EM | Admit: 2022-04-30 | Discharge: 2022-05-01 | Disposition: A | Discharge status: Home / Self Care | Payer: Self-pay | Attending: Emergency Medicine | Admitting: Emergency Medicine

## 2022-04-30 DIAGNOSIS — Z79899 Other long term (current) drug therapy: Secondary | ICD-10-CM | POA: Insufficient documentation

## 2022-04-30 DIAGNOSIS — I10 Essential (primary) hypertension: Secondary | ICD-10-CM | POA: Insufficient documentation

## 2022-04-30 DIAGNOSIS — R079 Chest pain, unspecified: Secondary | ICD-10-CM

## 2022-04-30 DIAGNOSIS — R9431 Abnormal electrocardiogram [ECG] [EKG]: Secondary | ICD-10-CM | POA: Insufficient documentation

## 2022-04-30 LAB — BASIC METABOLIC PANEL
Anion gap: 15 (ref 5–15)
BUN: 10 mg/dL (ref 6–20)
CO2: 25 mmol/L (ref 22–32)
Calcium: 9.5 mg/dL (ref 8.9–10.3)
Chloride: 102 mmol/L (ref 98–111)
Creatinine, Ser: 0.62 mg/dL (ref 0.44–1.00)
GFR, Estimated: >60 mL/min (ref >60)
Glucose, Bld: 124 mg/dL — ABNORMAL HIGH (ref 70–99)
Potassium: 3.5 mmol/L (ref 3.5–5.1)
Sodium: 142 mmol/L (ref 135–145)

## 2022-04-30 LAB — I-STAT BETA HCG BLOOD, ED (MC, WL, AP ONLY): I-stat hCG, quantitative: <5 m[IU]/mL (ref <5)

## 2022-04-30 LAB — CBC
HCT: 44 % (ref 36.0–46.0)
Hemoglobin: 15 g/dL (ref 12.0–15.0)
MCH: 31.2 pg (ref 26.0–34.0)
MCHC: 34.1 g/dL (ref 30.0–36.0)
MCV: 91.5 fL (ref 80.0–100.0)
Platelets: 300 10*3/uL (ref 150–400)
RBC: 4.81 MIL/uL (ref 3.87–5.11)
RDW: 11.6 % (ref 11.5–15.5)
WBC: 6.6 10*3/uL (ref 4.0–10.5)
nRBC: 0 % (ref 0.0–0.2)

## 2022-04-30 LAB — TROPONIN I (HIGH SENSITIVITY): Troponin I (High Sensitivity): 4 ng/L (ref <18)

## 2022-04-30 NOTE — ED Triage Notes (Signed)
Pt is here after being evaluated for palpitations, chest pain and uncontrolled htn (185/107 per MD note) at her PCP.  She was told that her EKG was abnormal and for her to come here for evaluation.  Pt EKG from PCP reads "abnormal EKG, inferior infarct NSR"  Pt is having less pain now, a pinching in left chest and soreness in left shoulder. No SOB at this time

## 2022-04-30 NOTE — ED Notes (Signed)
Pt called X2 for vitals recheck. No answer.

## 2022-04-30 NOTE — ED Provider Triage Note (Signed)
Emergency Medicine Provider Triage Evaluation Note  Stephanie Dougherty , a 51 y.o. female  was evaluated in triage.  Pt complains of chest pain and hypertension.  Seen at her PCP office today with an abnormal EKG sent to the ER.  Followed by cardiology for hypertension..  Review of Systems  Positive: Chest pain, shortness of breath Negative: Vomiting, diaphoresis  Physical Exam  BP (!) 171/94 (BP Location: Left Arm)   Pulse 79   Temp 98.3 F (36.8 C) (Oral)   Resp (!) 22   LMP  (LMP Unknown)   SpO2 100%  Gen:   Awake, no distress   Resp:  Normal effort  MSK:   Moves extremities without difficulty  Other:  Heart regular rate and rhythm.  Lungs clear to auscultation bilaterally.  Medical Decision Making  Medically screening exam initiated at 7:16 PM.  Appropriate orders placed.  [facility] was informed that the remainder of the evaluation will be completed by another provider, this initial triage assessment does not replace that evaluation, and the importance of remaining in the ED until their evaluation is complete.  EKG shows Q waves in the inferior leads.  Blood work and extremity is pending at this time.  Will need ACS rule out.  Patient hemodynamically stable at this time.   Doristine Devoid, PA-C 04/30/22 [phone]

## 2022-05-01 LAB — TROPONIN I (HIGH SENSITIVITY): Troponin I (High Sensitivity): 3 ng/L (ref <18)

## 2022-05-01 NOTE — ED Notes (Signed)
Pt son at bedside to interpret discharge instructions. Time for questions provided. Pt and family deny any further questions and deny any further needs at this time. Pt ambulatory out of ED.

## 2022-05-01 NOTE — ED Notes (Signed)
Dr. Glick at bedside.  

## 2022-05-01 NOTE — ED Provider Notes (Signed)
[facility] EMERGENCY DEPARTMENT Provider Note   CSN: [id number] Arrival date & time: 04/30/22  1746     History  Chief Complaint  Patient presents with   Chest Pain    Stephanie Dougherty is a 51 y.o. female.  The history is provided by the patient. A language interpreter was used.  Chest Pain She has history of hypertension, hyperlipidemia, prediabetes and comes in because of chest pain and an abnormal electrocardiogram.  She has upper chest pain fairly frequently.  Pain waxes and wanes without any particular pattern.  She went to her primary care provider today who did an electrocardiogram which was abnormal and she was sent to the emergency department for further evaluation.  She states that her pain has been waxing and waning for quite some time and this has not changed in pattern.  She denies dyspnea, nausea, diaphoresis.  She is a non-smoker, but there is a strong family history of premature coronary atherosclerosis.   Home Medications Prior to Admission medications   Medication Sig Start Date End Date Taking? Authorizing Provider  amLODipine (NORVASC) 5 MG tablet Take 1 tablet by mouth once daily 01/10/21   Mack Hook, MD  atorvastatin (LIPITOR) 40 MG tablet 1/2 tab by mouth daily with evening meal. Patient not taking: Reported on 03/05/2022 10/05/20   Mack Hook, MD  Cholecalciferol (VITAMIN D3) 25 MCG (1000 UT) CAPS Take by mouth daily.    [provider]  metFORMIN (GLUCOPHAGE) 500 MG tablet Take by mouth daily.    [provider]  Omega-3 Fatty Acids (OMEGA 3 500 PO) Take 1 capsule by mouth daily.    [provider]  pantoprazole (PROTONIX) 40 MG tablet Take 1 tablet (40 mg total) by mouth daily. 02/04/22   Vladimir Crofts, PA-C      Allergies    Losartan    Review of Systems   Review of Systems  Cardiovascular:  Positive for chest pain.  All other systems reviewed and are negative.   Physical  Exam Updated Vital Signs BP (!) 154/89   Pulse (!) 56   Temp 98 F (36.7 C)   Resp 18   LMP  (LMP Unknown)   SpO2 97%  Physical Exam Vitals and nursing note reviewed.   51 year old female, resting comfortably and in no acute distress. Vital signs are significant for elevated blood pressure and borderline slow heart rate. Oxygen saturation is 97%, which is normal. Head is normocephalic and atraumatic. PERRLA, EOMI. Oropharynx is clear. Neck is nontender and supple without adenopathy or JVD. Back is nontender and there is no CVA tenderness. Lungs are clear without rales, wheezes, or rhonchi. Chest is mildly tender in the left supraclavicular area.  There is no crepitus. Heart has regular rate and rhythm without murmur. Abdomen is soft, flat, nontender. Extremities have no cyanosis or edema, full range of motion is present. Skin is warm and dry without rash. Neurologic: Mental status is normal, cranial nerves are intact, moves all extremities equally.    ED Results / Procedures / Treatments   Labs (all labs ordered are listed, but only abnormal results are displayed) Labs Reviewed  BASIC METABOLIC PANEL - Abnormal; Notable for the following components:      Result Value   Glucose, Bld 124 (*)    All other components within normal limits  CBC  I-STAT BETA HCG BLOOD, ED ([facility], [facility], [facility] ONLY)  TROPONIN I (HIGH SENSITIVITY)  TROPONIN I (HIGH SENSITIVITY)  EKG EKG Interpretation  Date/Time:  Tuesday April 30 2022 18:00:22 EDT Ventricular Rate:  73 PR Interval:  144 QRS Duration: 78 QT Interval:  390 QTC Calculation: 429 R Axis:   23 Text Interpretation: Normal sinus rhythm Cannot rule out Inferior infarct , age undetermined Anterior infarct , age undetermined Abnormal ECG When compared with ECG of 23-Sep-2018 22:59, No significant change was found Confirmed by Delora Fuel ([id number]) on 05/01/2022 12:02:31 AM  Radiology DG Chest 2 View  Result Date: 04/30/2022 CLINICAL  DATA:  Chest pain. EXAM: CHEST - 2 VIEW COMPARISON:  September 23, 2018 FINDINGS: The heart size and mediastinal contours are within normal limits. Low lung volumes are noted. Both lungs are clear. Radiopaque surgical clips are seen within the right upper quadrant. The visualized skeletal structures are unremarkable. IMPRESSION: No active cardiopulmonary disease. Electronically Signed   By: Virgina Norfolk M.D.   On: 04/30/2022 19:59    Procedures Procedures    Medications Ordered in ED Medications - No data to display  ED Course/ Medical Decision Making/ A&P                           Medical Decision Making Amount and/or Complexity of Data Reviewed Labs: ordered. Radiology: ordered.   Chest pain which is very nonspecific, suspect musculoskeletal pain.  I have low index of suspicion for cardiac origin.  Also, doubt pericarditis, pneumonia, thoracic aneurysm.  I have reviewed her past records, and she has a prior ED visit on 09/23/2018 for chest pain and negative work-up.  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  No evidence of any acute cardiac process on electrocardiogram.  I have reviewed and interpreted her laboratory tests, and my interpretation is mild elevated random glucose, normal troponin x2.  Chest x-ray shows no active cardiopulmonary disease.  I have independently viewed the images, and agree with radiologist's interpretation.  Heart score is 3, which puts her at low risk for major adverse cardiac events in the next 30 days.  I have reassured her regarding negative work-up and am referring her back to her cardiologist for managing her blood pressure and decision as to whether any additional cardiac testing should be done  as an outpatient.  Return precautions discussed.  Final Clinical Impression(s) / ED Diagnoses Final diagnoses:  Nonspecific chest pain  Elevated blood pressure reading with diagnosis of hypertension    Rx / DC Orders ED Discharge Orders     None         Delora Fuel, MD 24/58/09 [phone]

## 2022-05-01 NOTE — Discharge Instructions (Signed)
Su electrocardiograma no es diferente al [employer] realizaron en 2020.  Su anlisis de sangre no mostr ningn signo de ataque cardaco.  Puede aplicar hielo en el rea dolorida de su pecho segn sea necesario. Puede tomar ibuprofeno y acetaminofn segn sea necesario para Conservation officer, historic buildings.  Contine controlando su presin arterial [employer]. Mantenga un registro y llvelo con usted cuando visite a su mdico.  Regrese si tiene algn sntoma nuevo o preocupante.

## 2022-05-14 NOTE — Progress Notes (Signed)
Cardiology Office Note:    Date:  05/16/2022   ID:  Stephanie Dougherty, DOB [birth year], MRN [id number]  PCP:  System, Provider Not In   [place] [facility] Cardiologist:  Lenna Sciara, MD Referring MD: Oval Linsey, NP   Chief Complaint/Reason for Referral: Chest pain  ASSESSMENT:    1. Precordial pain   2. Prediabetes   3. Mixed hyperlipidemia   4. Primary hypertension   5. Palpitations     PLAN:    In order of problems listed above: 1.  Chest pain: Her chest pain is positional in nature and not exertional.  I think this is atypical and likely musculoskeletal. 2.  Prediabetes: On metformin; this is being followed by other providers.  If she develops confirmed diabetes Aspirin 81 mg is advised. 3.  Hyperlipidemia: Continue atorvastatin; if she develops confirmed diabetes then a goal LDL is less than 70. 4.  Hypertension: We will start amlodipine 10 mg daily. 5.  Palpitations: We will obtain a monitor and echocardiogram to evaluate further.             Dispo:  Return if symptoms worsen or fail to improve.      Medication Adjustments/Labs and Tests Ordered: Current medicines are reviewed at length with the patient today.  Concerns regarding medicines are outlined above.  The following changes have been made:  no change   Labs/tests ordered: Orders Placed This Encounter  Procedures   LONG TERM MONITOR (3-14 DAYS)   ECHOCARDIOGRAM COMPLETE    Medication Changes: Meds ordered this encounter  Medications   amLODipine (NORVASC) 10 MG tablet    Sig: Take 1 tablet (10 mg total) by mouth daily.    Dispense:  90 tablet    Refill:  3     Current medicines are reviewed at length with the patient today.  The patient does not have concerns regarding medicines.   History of Present Illness:    FOCUSED PROBLEM LIST:   1.  Prediabetes on metformin 2.  Hypertension 3.  Hyperlipidemia  The patient is a 51 y.o. female with the indicated medical  history here for recommendations regarding chest pain.  Patient was seen in the emergency department recently with complaints of chest pain.  She went to PCPs and EKG was done which was abnormal she was sent to the emergency department.  Her EKG there demonstrated sinus rhythm with possible inferior infarction pattern and nonspecific ST-T wave changes.  Her troponins laboratories were unremarkable.  Chest x-ray showed no acute cardiopulmonary disease.  She was discharged home.   She tells me she occasionally gets palpitations when she exerts herself.  She gets left shoulder pain when she lays on her side.  This does not occur with exertion.  She denies any significant shortness of breath, presyncope or syncope.  She has had no signs or symptoms of stroke.  She denies any paroxysmal nocturnal dyspnea.  She has had no edema but does have varicose veins.  She does not smoke.  She is compliant with her medical therapy.          Current Medications: Current Meds  Medication Sig   amLODipine (NORVASC) 10 MG tablet Take 1 tablet (10 mg total) by mouth daily.   Cholecalciferol (VITAMIN D3) 25 MCG (1000 UT) CAPS Take by mouth daily.   Omega-3 Fatty Acids (OMEGA 3 500 PO) Take 1 capsule by mouth daily.   pantoprazole (PROTONIX) 40 MG tablet Take 1 tablet (40 mg total) by mouth daily.  telmisartan-hydrochlorothiazide (MICARDIS HCT) 80-12.5 MG tablet Take 1 tablet by mouth every morning.     Allergies:    Losartan   Social History:   Social History   Tobacco Use   Smoking status: Never   Smokeless tobacco: Never  Vaping Use   Vaping Use: Never used  Substance Use Topics   Alcohol use: No   Drug use: No     Family Hx: Family History  Problem Relation Age of Onset   Hypertension Mother    Diabetes Father    Hypertension Father    Lung disease Sister    Deep vein thrombosis Sister    Diabetes Brother    Diabetes Brother    Celiac disease Brother    Hiatal hernia Brother    Diabetes  Brother    Obesity Son    Obesity Son      Review of Systems:   Please see the history of present illness.    All other systems reviewed and are negative.     EKGs/Labs/Other Test Reviewed:    EKG:  EKG performed September 2023 that I personally reviewed demonstrates sinus rhythm with possible inferior infarction pattern of significant ST and T wave changes;  Prior CV studies: None available  Other studies Reviewed: Review of the additional studies/records demonstrates: None relevant  Recent Labs: 04/30/2022: BUN 10; Creatinine, Ser 0.62; Hemoglobin 15.0; Platelets 300; Potassium 3.5; Sodium 142   Recent Lipid Panel Lab Results  Component Value Date/Time   CHOL 261 (H) 02/12/2021 11:12 AM   TRIG 117 02/12/2021 11:12 AM   HDL 59 02/12/2021 11:12 AM   LDLCALC 181 (H) 02/12/2021 11:12 AM    Risk Assessment/Calculations:           HYPERTENSION CONTROL Vitals:   05/15/22 1509 05/15/22 1549  BP: (!) 160/90 (!) 160/80    The patient's blood pressure is elevated above target today.  In order to address the patient's elevated BP: A new medication was prescribed today.       Physical Exam:    VS:  BP (!) 160/80   Pulse 90   Ht '4\' 9"'$  (1.448 m)   Wt 134 lb (60.8 kg)   LMP  (LMP Unknown)   SpO2 98%   BMI 29.00 kg/m    Wt Readings from Last 3 Encounters:  05/15/22 134 lb (60.8 kg)  03/05/22 135 lb (61.2 kg)  02/04/22 135 lb (61.2 kg)    GENERAL:  No apparent distress, AOx3 HEENT:  No carotid bruits, +2 carotid impulses, no scleral icterus CAR: RRR no murmurs, gallops, rubs, or thrills RES:  Clear to auscultation bilaterally ABD:  Soft, nontender, nondistended, positive bowel sounds x 4 VASC:  +2 radial pulses, +2 carotid pulses, palpable pedal pulses NEURO:  CN 2-12 grossly intact; motor and sensory grossly intact PSYCH:  No active depression or anxiety EXT:  No edema, ecchymosis, or cyanosis  Signed, Early Osmond, MD  05/16/2022 [address], [place], [place]  [address] Phone: [phone]; Fax: [phone]   Note:  This document was prepared using Dragon voice recognition software and may include unintentional dictation errors.

## 2022-05-15 ENCOUNTER — Ambulatory Visit (INDEPENDENT_AMBULATORY_CARE_PROVIDER_SITE_OTHER): Payer: Self-pay | Admitting: Internal Medicine

## 2022-05-15 ENCOUNTER — Ambulatory Visit: Payer: Self-pay | Attending: Internal Medicine

## 2022-05-15 ENCOUNTER — Encounter: Payer: Self-pay | Admitting: Internal Medicine

## 2022-05-15 VITALS — BP 160/80 | HR 90 | Ht <= 58 in | Wt 134.0 lb

## 2022-05-15 DIAGNOSIS — I10 Essential (primary) hypertension: Secondary | ICD-10-CM

## 2022-05-15 DIAGNOSIS — R002 Palpitations: Secondary | ICD-10-CM

## 2022-05-15 DIAGNOSIS — E782 Mixed hyperlipidemia: Secondary | ICD-10-CM

## 2022-05-15 DIAGNOSIS — R7303 Prediabetes: Secondary | ICD-10-CM

## 2022-05-15 DIAGNOSIS — R072 Precordial pain: Secondary | ICD-10-CM

## 2022-05-15 MED ORDER — AMLODIPINE BESYLATE 10 MG PO TABS: 10.0000 mg | ORAL_TABLET | Freq: Every day | ORAL | 3 refills | Status: DC

## 2022-05-15 NOTE — Progress Notes (Unsigned)
Enrolled for [employer] to mail a ZIO XT long term holter monitor to the patients address on file.   Instructions in Spanish requested.

## 2022-05-15 NOTE — Patient Instructions (Signed)
Medication Instructions:  Your physician has recommended you make the following change in your medication:    Amlodipine '10mg'$  daily  *If you need a refill on your cardiac medications before your next appointment, please call your pharmacy*    Testing/Procedures:\ Your physician has recommended that you wear an event monitor. Event monitors are medical devices that record the heart's electrical activity. Doctors most often [place] these monitors to diagnose arrhythmias. Arrhythmias are problems with the speed or rhythm of the heartbeat. The monitor is a small, portable device. You can wear one while you do your normal daily activities. This is usually used to diagnose what is causing palpitations/syncope (passing out).  ECHO Your physician has requested that you have an echocardiogram. Echocardiography is a painless test that uses sound waves to create images of your heart. It provides your doctor with information about the size and shape of your heart and how well your heart's chambers and valves are working. This procedure takes approximately one hour. There are no restrictions for this procedure.    Follow-Up: At [facility], you and your health needs are our priority.  As part of our continuing mission to provide you with exceptional heart care, we have created designated Provider Care Teams.  These Care Teams include your primary Cardiologist (physician) and Advanced Practice Providers (APPs -  Physician Assistants and Nurse Practitioners) who all work together to provide you with the care you need, when you need it.  We recommend signing up for the patient portal called "MyChart".  Sign up information is provided on this After Visit Summary.  MyChart is used to connect with patients for Virtual Visits (Telemedicine).  Patients are able to view lab/test results, encounter notes, upcoming appointments, etc.  Non-urgent messages can be sent to your provider as well.   To learn more about  what you can do with MyChart, go to [email or web address].    Your next appointment:   As needed  The format for your next appointment:   In Person  Provider:   Dr. Ali Lowe    Other Instructions [place] Monitor Instructions  Your physician has requested you wear a ZIO patch monitor for 7 days.  This is a single patch monitor. [employer] supplies one patch monitor per enrollment. Additional stickers are not available. Please do not apply patch if you will be having a Nuclear Stress Test,  Echocardiogram, Cardiac CT, MRI, or Chest Xray during the period you would be wearing the  monitor. The patch cannot be worn during these tests. You cannot remove and re-apply the  ZIO XT patch monitor.  Your ZIO patch monitor will be mailed 3 day USPS to your address on file. It may take 3-5 days  to receive your monitor after you have been enrolled.  Once you have received your monitor, please review the enclosed instructions. Your monitor  has already been registered assigning a specific monitor serial # to you.  Billing and Patient Assistance Program Information  We have supplied [employer] with any of your insurance information on file for billing purposes. [employer] offers a sliding scale Patient Assistance Program for patients that do not have  insurance, or whose insurance does not completely cover the cost of the ZIO monitor.  You must apply for the Patient Assistance Program to qualify for this discounted rate.  To apply, please call [employer] at [phone], select option 4, select option 2, ask to apply for  Patient Assistance Program. Theodore Demark will ask your household income,  and how many people  are in your household. They will quote your out-of-pocket cost based on that information.  [employer] will also be able to set up a 73-month interest-free payment plan if needed.  Applying the monitor   Shave hair from upper left chest.  Hold abrader disc by orange tab. Rub abrader in 40  strokes over the upper left chest as  indicated in your monitor instructions.  Clean area with 4 enclosed alcohol pads. Let dry.  Apply patch as indicated in monitor instructions. Patch will be placed under collarbone on left  side of chest with arrow pointing upward.  Rub patch adhesive wings for 2 minutes. Remove white label marked "1". Remove the white  label marked "2". Rub patch adhesive wings for 2 additional minutes.  While looking in a mirror, press and release button in center of patch. A small green light will  flash 3-4 times. This will be your only indicator that the monitor has been turned on.  Do not shower for the first 24 hours. You may shower after the first 24 hours.  Press the button if you feel a symptom. You will hear a small click. Record Date, Time and  Symptom in the Patient Logbook.  When you are ready to remove the patch, follow instructions on the last 2 pages of Patient  Logbook. Stick patch monitor onto the last page of Patient Logbook.  Place Patient Logbook in the blue and white box. Use locking tab on box and tape box closed  securely. The blue and white box has prepaid postage on it. Please place it in the mailbox as  soon as possible. Your physician should have your test results approximately 7 days after the  monitor has been [facility]  Call [employer] [facility] [phone] you have questions regarding  your ZIO XT patch monitor. Call them immediately if you see an orange light blinking on your  monitor.  If your monitor falls off in less than 4 days, contact our Monitor department at [phone]  If your monitor becomes loose or falls off after 4 days call [employer] at [phone]  suggestions on securing your monitor

## 2022-05-19 DIAGNOSIS — R002 Palpitations: Secondary | ICD-10-CM

## 2022-05-31 ENCOUNTER — Ambulatory Visit (HOSPITAL_COMMUNITY): Payer: Self-pay | Attending: Internal Medicine

## 2022-05-31 DIAGNOSIS — R002 Palpitations: Secondary | ICD-10-CM | POA: Insufficient documentation

## 2022-05-31 DIAGNOSIS — R079 Chest pain, unspecified: Secondary | ICD-10-CM

## 2022-05-31 LAB — ECHOCARDIOGRAM COMPLETE
Area-P 1/2: 3.6 cm2
S' Lateral: 2.5 cm

## 2022-08-06 ENCOUNTER — Telehealth: Payer: Self-pay | Admitting: Internal Medicine

## 2022-08-06 NOTE — Telephone Encounter (Signed)
Pt called back per message received in [place] Triage.   Unable to leave message for Pt to call back.  Follow up required.

## 2022-08-06 NOTE — Telephone Encounter (Signed)
Pt c/o swelling: STAT is pt has developed SOB within 24 hours  How much weight have you gained and in what time span? Doesn't believes she has gain weight   If swelling, where is the swelling located? Both feet and ankles  Are you currently taking a fluid pill? No  Are you currently SOB? No   Do you have a log of your daily weights (if so, list)?   Have you gained 3 pounds in a day or 5 pounds in a week? No  Have you traveled recently? No  Pt son believes this swelling may be caused by medication prescribed at last visit.  Requesting call back to discuss.

## 2022-08-07 NOTE — Telephone Encounter (Signed)
I spoke with the patient's son.  The patient is there and has given him permission to translate for her.  She is feeling well other than the swelling in her feet and ankles  she took amlodipine in the past and had swelling and so it was changed.  Her med list shows telmisartan-hctz 80-12.5 mg but the patient reported that it made her feel poorly and didn't do a good job controlling her BP so it was stopped.  I marked it as not taking.  I adv of Dr. Dara Hoyer recommendation for hctz 25 mg daily.  Since she didn't tolerate Micardis, she would like to have a different alternative or stay on amlodipine if she has too.    The patient and her son are aware I will review with the doctor and call them back.

## 2022-08-08 MED ORDER — NEBIVOLOL HCL 5 MG PO TABS: 5.0000 mg | ORAL_TABLET | Freq: Every day | ORAL | 5 refills | Status: DC

## 2022-08-08 NOTE — Telephone Encounter (Signed)
Message from Dr. Ali Lowe: Stephanie Dougherty try Bystolic 5 mg.   Called and spoke with Erven Colla, the patient's son who translates for her.  Adv for her to stop amlodipine and begin Bystolic 5 mg daily.  Will monitor BP and is aware that if swelling does not improve off amlodipine she needs to let us know.

## 2022-08-08 NOTE — Addendum Note (Signed)
Addended by: Rodman Key on: 08/08/2022 10:50 AM   Modules accepted: Orders

## 2022-09-09 ENCOUNTER — Telehealth: Payer: Self-pay | Admitting: Internal Medicine

## 2022-09-09 DIAGNOSIS — I10 Essential (primary) hypertension: Secondary | ICD-10-CM

## 2022-09-09 NOTE — Telephone Encounter (Signed)
Pt c/o medication issue:  1. Name of Medication:  amlodipine   2. How are you currently taking this medication (dosage and times per day)?  3. Are you having a reaction (difficulty breathing--STAT)?   4. What is your medication issue? Patient son called stating his mother would like to go back on amlodipine.  She wants to stop taking the current medication she is on (she does not the name of it), he states she said the current medication she is on is not bringing her blood pressure down.

## 2022-09-09 NOTE — Telephone Encounter (Signed)
Spoke w the patient's son.  The patient is asking to go back on amlodipine and stop bystolic because her BPs are still running higher than they were on amlodipine.  140s-158/80s  her HR resting is about 50.  She states the swelling she got on amlodipine was very uncomfortable but it did the best at keeping her BP 120/80.  Asked patient to remain on bystolic 5 mg for now and scheduled her w PharmD (referral placed) to help w medication management for her bp.  The patient is in agreement with this plan.

## 2022-09-11 ENCOUNTER — Encounter (HOSPITAL_COMMUNITY): Payer: Self-pay

## 2022-09-11 ENCOUNTER — Ambulatory Visit (HOSPITAL_COMMUNITY)
Admission: EM | Admit: 2022-09-11 | Discharge: 2022-09-11 | Disposition: A | Discharge status: Home / Self Care | Payer: Self-pay | Attending: Internal Medicine | Admitting: Internal Medicine

## 2022-09-11 DIAGNOSIS — L232 Allergic contact dermatitis due to cosmetics: Secondary | ICD-10-CM

## 2022-09-11 MED ORDER — PREDNISONE 20 MG PO TABS: 40.0000 mg | ORAL_TABLET | Freq: Every day | ORAL | 0 refills | Status: AC

## 2022-09-11 NOTE — ED Provider Notes (Signed)
[facility]    CSN: [id number] Arrival date & time: 09/11/22  1659      History   Chief Complaint Chief Complaint  Patient presents with   Rash    HPI Stephanie Dougherty is a 52 y.o. female.   Patient presents urgent care for evaluation of rash to the bilateral eyes that started 1 week ago after she started using new creams to the face from [facility] and [place].  Initially, she was not experiencing any physical signs of a rash but was experiencing bilateral eye itching and irritation.  She noticed the rash start to the face 3 days ago.  Rash is blotchy and itchy/red.  Denies warmth or drainage from the rash.Patient states that she used the creams to her whole entire face but is only experiencing rash to the bilateral eyes and bridge of the nose.  Reports normal vision without dizziness, blurry vision, or recent trauma/injury to the eyes.  Denies photophobia, spots in vision, and floaters in vision. Denies eye drainage and eye redness. Eyes and rash are itchy.  She does not wear contacts.  No fever/chills.  Has not attempted use of any over-the-counter medications before coming to urgent care to help with symptoms.     Past Medical History:  Diagnosis Date   Anxiety    Blood transfusion without reported diagnosis    Chest pain    Hyperlipidemia    Hypertension 2019   Prediabetes     Patient Active Problem List   Diagnosis Date Noted   Labial skin tag 04/20/2019   Prediabetes    Varicose veins of both lower extremities without ulcer or inflammation 01/06/2019   Low serum vitamin D 12/23/2018   Hyperglycemia    Hyperlipidemia    Hypertension 08/05/2017    Past Surgical History:  Procedure Laterality Date   ABDOMINAL HYSTERECTOMY     No oophorectomy; emergent hysterectomy for postpartum bleeding.   CHOLECYSTECTOMY  2001    OB History     Gravida  2   Para  2   Term  2   Preterm      AB      Living  2      SAB      IAB      Ectopic       Multiple      Live Births  2            Home Medications    Prior to Admission medications   Medication Sig Start Date End Date Taking? Authorizing Provider  nebivolol (BYSTOLIC) 5 MG tablet Take 1 tablet (5 mg total) by mouth daily. 08/08/22  Yes Early Osmond, MD  predniSONE (DELTASONE) 20 MG tablet Take 2 tablets (40 mg total) by mouth daily for 5 days. 09/11/22 09/16/22 Yes StanhopeStasia Cavalier, FNP  Cholecalciferol (VITAMIN D3) 25 MCG (1000 UT) CAPS Take by mouth daily.    [provider]  Omega-3 Fatty Acids (OMEGA 3 500 PO) Take 1 capsule by mouth daily.    [provider]  pantoprazole (PROTONIX) 40 MG tablet Take 1 tablet (40 mg total) by mouth daily. 02/04/22   Vladimir Crofts, PA-C  telmisartan-hydrochlorothiazide (MICARDIS HCT) 80-12.5 MG tablet Take 1 tablet by mouth every morning. Patient not taking: Reported on 08/07/2022 04/30/22   [provider]    Family History Family History  Problem Relation Age of Onset   Hypertension Mother    Diabetes Father    Hypertension  Father    Lung disease Sister    Deep vein thrombosis Sister    Diabetes Brother    Diabetes Brother    Celiac disease Brother    Hiatal hernia Brother    Diabetes Brother    Obesity Son    Obesity Son     Social History Social History   Tobacco Use   Smoking status: Never   Smokeless tobacco: Never  Vaping Use   Vaping Use: Never used  Substance Use Topics   Alcohol use: No   Drug use: No     Allergies   Losartan   Review of Systems Review of Systems Per HPI  Physical Exam Triage Vital Signs ED Triage Vitals  Enc Vitals Group     BP 09/11/22 1806 (!) 155/90     Pulse Rate 09/11/22 1806 74     Resp 09/11/22 1806 18     Temp 09/11/22 1806 98.6 F (37 C)     Temp Source 09/11/22 1806 Oral     SpO2 09/11/22 1806 98 %     Weight --      Height --      Head Circumference --      Peak Flow --      Pain Score 09/11/22 1805 0     Pain Loc --       Pain Edu? --      Excl. in [place]? --    No data found.  Updated Vital Signs BP (!) 155/90 (BP Location: Right Arm)   Pulse 74   Temp 98.6 F (37 C) (Oral)   Resp 18   LMP  (LMP Unknown)   SpO2 98%   Visual Acuity Right Eye Distance:   Left Eye Distance:   Bilateral Distance:    Right Eye Near:   Left Eye Near:    Bilateral Near:     Physical Exam Vitals and nursing note reviewed.  Constitutional:      Appearance: She is not ill-appearing or toxic-appearing.  HENT:     Head: Normocephalic and atraumatic.     Right Ear: Hearing, tympanic membrane, ear canal and external ear normal.     Left Ear: Hearing, tympanic membrane, ear canal and external ear normal.     Nose: Nose normal.     Mouth/Throat:     Lips: Pink.     Mouth: Mucous membranes are moist.     Pharynx: No posterior oropharyngeal erythema.  Eyes:     General: Lids are normal. Vision grossly intact. Gaze aligned appropriately.        Right eye: No discharge.        Left eye: No discharge.     Extraocular Movements: Extraocular movements intact.     Conjunctiva/sclera: Conjunctivae normal.  Pulmonary:     Effort: Pulmonary effort is normal.  Musculoskeletal:     Cervical back: Neck supple.  Skin:    General: Skin is warm and dry.     Capillary Refill: Capillary refill takes less than 2 seconds.     Findings: Rash present.     Comments: Blotchy, erythematous, and macular rash to the nasal bridge and bilateral preseptal eyes. See image below for further detail. No warmth or drainage to rash. No drainage from eyes. Rash is itchy.   Neurological:     General: No focal deficit present.     Mental Status: She is alert and oriented to person, place, and time. Mental status is at baseline.  Cranial Nerves: No dysarthria or facial asymmetry.     Motor: No weakness.     Gait: Gait normal.  Psychiatric:        Mood and Affect: Mood normal.        Speech: Speech normal.        Behavior: Behavior normal.         Thought Content: Thought content normal.        Judgment: Judgment normal.        UC Treatments / Results  Labs (all labs ordered are listed, but only abnormal results are displayed) Labs Reviewed - No data to display  EKG   Radiology No results found.  Procedures Procedures (including critical care time)  Medications Ordered in UC Medications - No data to display  Initial Impression / Assessment and Plan / UC Course  I have reviewed the triage vital signs and the nursing notes.  Pertinent labs & imaging results that were available during my care of the patient were reviewed by me and considered in my medical decision making (see chart for details).   1.  Allergic contact dermatitis due to cosmetics Presentation is consistent with acute allergic reaction due to cosmetics.  Low suspicion for infectious etiology as she does not have a fever, chills, and is not displaying drainage from the eyes.  Prednisone 40 mg burst sent to pharmacy to be taken as directed with food.  No NSAIDs while taking prednisone due to increased risk of GI bleeding.  Vies to avoid use of creams/lotions to the face until allergic reaction subsides.  PCP follow-up recommended.  Discussed physical exam and available lab work findings in clinic with patient.  Counseled patient regarding appropriate use of medications and potential side effects for all medications recommended or prescribed today. Discussed red flag signs and symptoms of worsening condition,when to call the PCP office, return to urgent care, and when to seek higher level of care in the emergency department. Patient verbalizes understanding and agreement with plan. All questions answered. Patient discharged in stable condition.    Final Clinical Impressions(s) / UC Diagnoses   Final diagnoses:  Allergic contact dermatitis due to cosmetics     Discharge Instructions      Your rash is due to an allergic reaction.  Take prednisone  once daily as directed ('40mg'$  daily with breakfast for 5 days) to reduce swelling and inflammation.  You may take your first dose of prednisone tonight.  Take prednisone with food to avoid stomach upset. Do not take any NSAID containing medications when taking prednisone.  Follow-up with PCP if symptoms fail to improve in the next 2 to 3 days.  If you develop any new or worsening symptoms or do not improve in the next 2 to 3 days, please return.  If your symptoms are severe, please go to the emergency room.  Follow-up with your primary care provider for further evaluation and management of your symptoms as well as ongoing wellness visits.  I hope you feel better!     ED Prescriptions     Medication Sig Dispense Auth. Provider   predniSONE (DELTASONE) 20 MG tablet Take 2 tablets (40 mg total) by mouth daily for 5 days. 10 tablet Talbot Grumbling, FNP      PDMP not reviewed this encounter.   Talbot Grumbling, [place] 09/11/22 1925

## 2022-09-11 NOTE — ED Triage Notes (Signed)
Pt presents with a bilateral rash on the eyes X 1 week.   States it first started with eye tiredness and blurry vision, states she then noticed the rash.   States she has not taken anything for the rash, reports she has put on ointment.

## 2022-09-11 NOTE — Discharge Instructions (Addendum)
Your rash is due to an allergic reaction.  Take prednisone once daily as directed ('40mg'$  daily with breakfast for 5 days) to reduce swelling and inflammation.  You may take your first dose of prednisone tonight.  Take prednisone with food to avoid stomach upset. Do not take any NSAID containing medications when taking prednisone.  Follow-up with PCP if symptoms fail to improve in the next 2 to 3 days.  If you develop any new or worsening symptoms or do not improve in the next 2 to 3 days, please return.  If your symptoms are severe, please go to the emergency room.  Follow-up with your primary care provider for further evaluation and management of your symptoms as well as ongoing wellness visits.  I hope you feel better!

## 2022-10-02 ENCOUNTER — Ambulatory Visit
Payer: Self-pay | Attending: Cardiovascular Disease | Admitting: Pharmacist Clinician (PhC)/ Clinical Pharmacy Specialist

## 2022-10-02 VITALS — BP 173/93 | HR 62

## 2022-10-02 DIAGNOSIS — I10 Essential (primary) hypertension: Secondary | ICD-10-CM

## 2022-10-02 MED ORDER — LISINOPRIL 10 MG PO TABS: 10.0000 mg | ORAL_TABLET | Freq: Every day | ORAL | 3 refills | Status: DC

## 2022-10-02 MED ORDER — AMLODIPINE BESYLATE 2.5 MG PO TABS: 2.5000 mg | ORAL_TABLET | Freq: Every day | ORAL | 3 refills | Status: DC

## 2022-10-02 NOTE — Progress Notes (Signed)
Office Visit    Patient Name: Stephanie Dougherty Date of Encounter: 10/03/2022  Primary Care Provider:  System, Provider Not In Primary Cardiologist:  None  Chief Complaint    Hypertension  Significant Past Medical History   Pre-diabetes 10/21 A1c 5.7, was previously on metformin, followed by PCP  hyperlipidemia 7/22 LDL 181, no meds, followed by PCP?  palpitations 7 day monitor showed sinus rhythm, even in pt triggered events  Precordial pain Per Dr. Ali Lowe is positional in nature, not exertional, likely musculoskeletal       Allergies  Allergen Reactions   Losartan Palpitations    History of Present Illness    Stephanie Dougherty is a 52 y.o. female patient of Dr Ali Lowe, in the office today to discuss hypertension.  She was seen by Dr. Ali Lowe last October, with a BP of 160/80.  At that time the chart indicated she was taking telmisartan hctz.  Dr. Ali Lowe added amlodipine 10 mg.  In January she reached out to the office to complain of lower extremity edema, so the amlodipine was stopped and nebivolol 5 mg daily was started.  At that time she also noted the temlisartan hctz made her feel poorly, so she stopped it as well.  Another call in early February noted that she wanted to stop the nebivolol (she did not think it was working) and go back on amlodipine.  She was told to continue with the nebivolol and scheduled to see PharmD clinic for follow up.    Today she is in the office with her son here to translate.  She notes that for the first week or two on nebivolol she had headaches, diarrhea and felt her energy level was low.  It has improved somewhat, but she is still having occasional headaches and is concerned that her heart rate is more commonly in the 50's.  She took amlodipine in the past at the 5 mg dose and stated that it caused LEE at times, but was much worse when she was put on the 10 mg dose last fall.  She tells [place] today that she would prefer to be on that and  have edema, as it worked well.    Blood Pressure Goal:  130/80  Current Medications:  nebivolol 5 mg qd  Previously tried:  losartan - palpitations, amlodipine 10 mg - edema  Family Hx:  both parents living, both with hypertension, father since in his 69's, now in his 59's both in 97's doing well; has 8 siblings, 4 with known hypertension,  2 sons both healthy  Social Hx:      Tobacco:no  Alcohol:no  Caffeine:previously drank coffee, now only decaf  Diet:   mostly home cooked meals, leaves salt out if cooking for herself; plenty of vegetables fresh and frozen; protein chicken mostly, some beef; not much fish or pork  Exercise: 30  minutes  - cardio, stationary bike several times per week  Home BP readings:  24 home readings average 130/79  HR 61  (range 117-170/71-86)    Accessory Clinical Findings    Lab Results  Component Value Date   CREATININE 0.62 04/30/2022   BUN 10 04/30/2022   NA 142 04/30/2022   K 3.5 04/30/2022   CL 102 04/30/2022   CO2 25 04/30/2022   Lab Results  Component Value Date   ALT 26 03/02/2021   AST 24 03/02/2021   ALKPHOS 53 03/02/2021   BILITOT 0.6 03/02/2021   Lab Results  Component Value Date  HGBA1C 5.7 (H) 05/12/2020    Home Medications    Current Outpatient Medications  Medication Sig Dispense Refill   amLODipine (NORVASC) 2.5 MG tablet Take 1 tablet (2.5 mg total) by mouth daily. 90 tablet 3   lisinopril (ZESTRIL) 10 MG tablet Take 1 tablet (10 mg total) by mouth daily. 90 tablet 3   Cholecalciferol (VITAMIN D3) 25 MCG (1000 UT) CAPS Take by mouth daily.     nebivolol (BYSTOLIC) 5 MG tablet Take 1 tablet (5 mg total) by mouth daily. 30 tablet 5   Omega-3 Fatty Acids (OMEGA 3 500 PO) Take 1 capsule by mouth daily.     pantoprazole (PROTONIX) 40 MG tablet Take 1 tablet (40 mg total) by mouth daily. 30 tablet 3   No current facility-administered medications for this visit.     HYPERTENSION CONTROL Vitals:   10/02/22 1603  10/03/22 0922  BP: (!) 179/103 (!) 173/93    The patient's blood pressure is elevated above target today.  In order to address the patient's elevated BP: A current anti-hypertensive medication was adjusted today.; A new medication was prescribed today.     Assessment & Plan    Hypertension Assessment: BP is uncontrolled in office BP 173/93 mmHg;  above the goal (<130/80). Patient has not been happy with nebivolol and would prefer amlodipine despite LEE Denies SOB, palpitation, chest pain, headaches,or swelling She is uninsured and concerned about costs of repeat visits Reiterated the importance of regular exercise and low salt diet   Plan:  Stop taking nebivolol Start taking amlodipine 2.5 mg once daily and lisinopril 10 mg once daily Patient to keep record of BP readings with heart rate and report to [place] at the next visit Patient to follow up with 3 months in PharmD clinic for follow up (due to lack of insurance) Labs ordered today:  none - secondary to lack of insurance   Tommy Medal PharmD CPP [facility]  [address] [place], [place] [address] [phone]

## 2022-10-02 NOTE — Patient Instructions (Signed)
Follow up appointment: May 28 at 9:30 am  Take your BP meds as follows:  STOP nebivolol  START amlodipine 2.5 mg y lisinopril 10 mg una vez al dia  Check your blood pressure at home daily (if able) and keep record of the readings.  Hypertension "High blood pressure"  Hypertension is often called "The Silent Killer." It rarely causes symptoms until it is extremely  high or has done damage to other organs in the body. For this reason, you should have your  blood pressure checked regularly by your physician. We will check your blood pressure  every time you see a provider at one of our offices.   Your blood pressure reading consists of two numbers. Ideally, blood pressure should be  below 120/80. The first ("top") number is called the systolic pressure. It measures the  pressure in your arteries as your heart beats. The second ("bottom") number is called the diastolic pressure. It measures the pressure in your arteries as the heart relaxes between beats.  The benefits of getting your blood pressure under control are enormous. A 10-point  reduction in systolic blood pressure can reduce your risk of stroke by 27% and heart failure by 28%  Your blood pressure goal is < 130/80  To check your pressure at home you will need to:  1. Sit up in a chair, with feet flat on the floor and back supported. Do not cross your ankles or legs. 2. Rest your left arm so that the cuff is about heart level. If the cuff goes on your upper arm,  then just relax the arm on the table, arm of the chair or your lap. If you have a wrist cuff, we  suggest relaxing your wrist against your chest (think of it as Pledging the Flag with the  wrong arm).  3. Place the cuff snugly around your arm, about 1 inch above the crook of your elbow. The  cords should be inside the groove of your elbow.  4. Sit quietly, with the cuff in place, for about 5 minutes. After that 5 minutes press the power  button to start a  reading. 5. Do not talk or move while the reading is taking place.  6. Record your readings on a sheet of paper. Although most cuffs have a memory, it is often  easier to see a pattern developing when the numbers are all in front of you.  7. You can repeat the reading after 1-3 minutes if it is recommended  Make sure your bladder is empty and you have not had caffeine or tobacco within the last 30 min  Always bring your blood pressure log with you to your appointments. If you have not brought your monitor in to be double checked for accuracy, please bring it to your next appointment.  You can find a list of quality blood pressure cuffs at validatebp.org

## 2022-10-03 ENCOUNTER — Telehealth: Payer: Self-pay | Admitting: Licensed Clinical Social Worker

## 2022-10-03 ENCOUNTER — Encounter: Payer: Self-pay | Admitting: Pharmacist Clinician (PhC)/ Clinical Pharmacy Specialist

## 2022-10-03 NOTE — Assessment & Plan Note (Addendum)
Assessment: BP is uncontrolled in office BP 173/93 mmHg;  above the goal (<130/80). Patient has not been happy with nebivolol and would prefer amlodipine despite LEE Denies SOB, palpitation, chest pain, headaches,or swelling She is uninsured and concerned about costs of repeat visits Reiterated the importance of regular exercise and low salt diet   Plan:  Stop taking nebivolol Start taking amlodipine 2.5 mg once daily and lisinopril 10 mg once daily Patient to keep record of BP readings with heart rate and report to [place] at the next visit Patient to follow up with 3 months in PharmD clinic for follow up (due to lack of insurance) Labs ordered today:  none - secondary to lack of insurance

## 2022-10-03 NOTE — Progress Notes (Signed)
Heart and Vascular Care Navigation  10/03/2022  Stephanie Dougherty 07/03/1971 [id number]  Reason for Referral: Uninsured- needs to renew CAFA Patient is participating in a Managed Medicaid Plan: No, [facility] Financial Assistance renewal pending   Engaged with patient by telephone for initial visit for Heart and Vascular Care Coordination.                                                                                                   Assessment:  LCSW reached out to pt this morning with the assistance of [employer] [phone]. Introduced self, role, reason for call. Pt confirmed home address, lives with partner and their adult son as well as an adult nephew. No current insurance, previously had been approved for CAFA through January 2024. Pt does not work, partner does, but denies any issues with affording or obtaining food, housing, medications, or transportation. Pt does worry about ongoing medical care costs. We discussed reapplying for [facility] Financial Assistance and [employer] now that her previous approval has expired since she is not eligible for Medicaid at this time. Pt previously was seeing [facility] for PCP, is active with new provider but cannot remember their name at this time. Pt okay with me sending her applications and my contact so she can get back in touch with me if needed. No additional questions/concerns at this time.                                    HRT/VAS Care Coordination     Patients Home Cardiology Office [place] Team Social Worker   Social Worker Name: Westley Hummer, [place],  [place]   Lives with: Relatives; Significant Other; Adult Children   Patient Current Insurance Coverage Self-Pay   Patient Has Concern With Paying Medical Bills Yes   Patient Concerns With Medical Bills ongoimg medical care   Medical Bill Referrals: CAFA/[place] [facility]   Does Patient Have Prescription Coverage? No       Social  History:                                                                             SDOH Screenings   Food Insecurity: No Food Insecurity (10/03/2022)  Housing: Low Risk  (10/03/2022)  Transportation Needs: No Transportation Needs (10/03/2022)  Utilities: Not At Risk (10/03/2022)  Financial Resource Strain: Medium Risk (10/03/2022)  Physical Activity: Sufficiently Active (04/20/2019)  Social Connections: Unknown (04/20/2019)  Tobacco Use: Low Risk  (10/03/2022)    SDOH Interventions: Financial Resources:  Financial Strain Interventions: Other (Comment) [employer]; [employer]) Occupational hygienist for [place] Insecurity:  Food Insecurity Interventions: Intervention Not Indicated  Housing Insecurity:  Housing  Interventions: Intervention Not Indicated  Transportation:   Transportation Interventions: Intervention Not Indicated    Other Care Navigation Interventions:     Provided Pharmacy assistance resources  Pt denies any issues with accessibility and affordability of medications at [facility]. We discussed using [facility] pharmacies if interested.    Follow-up plan:   LCSW has mailed pt the following: my card, [facility] financial assistance application, [employer] (both in [place] language). I will f/u to ensure paperwork received and answer any additional questions.

## 2022-10-07 LAB — LAB REPORT - SCANNED: A1c: 6.3

## 2022-10-16 ENCOUNTER — Telehealth: Payer: Self-pay | Admitting: Licensed Clinical Social Worker

## 2022-10-16 NOTE — Telephone Encounter (Signed)
H&V Care Navigation CSW Progress Note  Clinical Social Worker contacted patient by phone to f/u on assistance applications sent previously. LCSW was able to reach pt with assistance of [employer], [place] [phone], she confirms receiving applications. She has been working on completing them- waiting on bank statements and plans on mailing them directly in for review to [place] and [employer]. She inquired about tax return-explained it should be 1040 form, with any attachments- if any confusion she should send the whole return in to be reviewed. I will f/u in a few weeks to see if sent/received by programs. No additional questions at this time  - I remain available if needed.   Patient is participating in a Managed Medicaid Plan:  No, self care only.   SDOH Screenings   Food Insecurity: No Food Insecurity (10/03/2022)  Housing: Low Risk  (10/03/2022)  Transportation Needs: No Transportation Needs (10/03/2022)  Utilities: Not At Risk (10/03/2022)  Financial Resource Strain: Medium Risk (10/03/2022)  Physical Activity: Sufficiently Active (04/20/2019)  Social Connections: Unknown (04/20/2019)  Tobacco Use: Low Risk  (10/03/2022)   Westley Hummer, MSW, [place]  [phone]- work cell phone (preferred) [phone]- desk phone

## 2022-10-28 ENCOUNTER — Telehealth: Payer: Self-pay | Admitting: Licensed Clinical Social Worker

## 2022-10-28 NOTE — Telephone Encounter (Signed)
H&V Care Navigation CSW Progress Note  PATIENT APPROVED FOR 100% FINANCIAL ASSISTANCE PER CAFA APPLICATION AND SUPPORTING DOCUMENTATION. CAFA APP SCANNED TO ACCOUNT [id number] APPROVAL DATES OF [employer] ---- 10/18/22 - 04/20/23 APPROVAL LETTER ON ACCOUNT [id number]   Will mail approval letter to pt.   Patient is participating in a Managed Medicaid Plan:  No, CAFA 100% approved  [place]: No Food Insecurity (10/03/2022)  Housing: Low Risk  (10/03/2022)  Transportation Needs: No Transportation Needs (10/03/2022)  Utilities: Not At Risk (10/03/2022)  Financial Resource Strain: Medium Risk (10/03/2022)  Physical Activity: Sufficiently Active (04/20/2019)  Social Connections: Unknown (04/20/2019)  Tobacco Use: Low Risk  (10/03/2022)    Westley Hummer, MSW, [place]  [phone]- work cell phone (preferred) [phone]- desk phone

## 2022-10-29 ENCOUNTER — Telehealth: Payer: Self-pay | Admitting: Licensed Clinical Social Worker

## 2022-10-29 NOTE — Telephone Encounter (Signed)
H&V Care Navigation CSW Progress Note  Clinical Social Worker contacted patient by phone to f/u on approval for [employer] . LCSW was able to reach her at [phone] with assistance of [employer] [phone]. We shared that she was approved for [facility] Financial Assistance at 100% and I have sent her approval letter she should bring to all appointments. Pt inquired about previous bills, provided her with number for billing so she can call and have them rebilled. No additional questions/concerns at this time.   Patient is participating in a Managed Medicaid Plan:  No, self pay, CAFA approved 100%  SDOH Screenings   Food Insecurity: No Food Insecurity (10/03/2022)  Housing: Low Risk  (10/03/2022)  Transportation Needs: No Transportation Needs (10/03/2022)  Utilities: Not At Risk (10/03/2022)  Financial Resource Strain: Medium Risk (10/03/2022)  Physical Activity: Sufficiently Active (04/20/2019)  Social Connections: Unknown (04/20/2019)  Tobacco Use: Low Risk  (10/03/2022)    Westley Hummer, MSW, [place]  [phone]- work cell phone (preferred) [phone]- desk phone

## 2022-12-31 ENCOUNTER — Encounter: Payer: Self-pay | Admitting: Pharmacist Clinician (PhC)/ Clinical Pharmacy Specialist

## 2022-12-31 ENCOUNTER — Ambulatory Visit
Payer: Self-pay | Attending: Cardiovascular Disease | Admitting: Pharmacist Clinician (PhC)/ Clinical Pharmacy Specialist

## 2022-12-31 VITALS — BP 124/84 | HR 72 | Ht 59.06 in | Wt 136.0 lb

## 2022-12-31 DIAGNOSIS — I10 Essential (primary) hypertension: Secondary | ICD-10-CM

## 2022-12-31 MED ORDER — LISINOPRIL 10 MG PO TABS: 10.0000 mg | ORAL_TABLET | Freq: Every day | ORAL | 3 refills | Status: DC

## 2022-12-31 MED ORDER — AMLODIPINE BESYLATE 2.5 MG PO TABS: 2.5000 mg | ORAL_TABLET | Freq: Every day | ORAL | 3 refills | Status: DC

## 2022-12-31 NOTE — Patient Instructions (Signed)
Follow up appointment: with Dr. Lynnette Caffey in October or November.  Please call sooner if you notice your blood pressure higher.    Take your BP meds as follows: continue with your current medications  Check your blood pressure at home daily (if able) and keep record of the readings.  Hypertension "High blood pressure"  Hypertension is often called "The Silent Killer." It rarely causes symptoms until it is extremely  high or has done damage to other organs in the body. For this reason, you should have your  blood pressure checked regularly by your physician. We will check your blood pressure  every time you see a provider at one of our offices.   Your blood pressure reading consists of two numbers. Ideally, blood pressure should be  below 120/80. The first ("top") number is called the systolic pressure. It measures the  pressure in your arteries as your heart beats. The second ("bottom") number is called the diastolic pressure. It measures the pressure in your arteries as the heart relaxes between beats.  The benefits of getting your blood pressure under control are enormous. A 10-point  reduction in systolic blood pressure can reduce your risk of stroke by 27% and heart failure by 28%  Your blood pressure goal is 130/80  To check your pressure at home you will need to:  1. Sit up in a chair, with feet flat on the floor and back supported. Do not cross your ankles or legs. 2. Rest your left arm so that the cuff is about heart level. If the cuff goes on your upper arm,  then just relax the arm on the table, arm of the chair or your lap. If you have a wrist cuff, we  suggest relaxing your wrist against your chest (think of it as Pledging the Flag with the  wrong arm).  3. Place the cuff snugly around your arm, about 1 inch above the crook of your elbow. The  cords should be inside the groove of your elbow.  4. Sit quietly, with the cuff in place, for about 5 minutes. After that 5  minutes press the power  button to start a reading. 5. Do not talk or move while the reading is taking place.  6. Record your readings on a sheet of paper. Although most cuffs have a memory, it is often  easier to see a pattern developing when the numbers are all in front of you.  7. You can repeat the reading after 1-3 minutes if it is recommended  Make sure your bladder is empty and you have not had caffeine or tobacco within the last 30 min  Always bring your blood pressure log with you to your appointments. If you have not brought your monitor in to be double checked for accuracy, please bring it to your next appointment.  You can find a list of quality blood pressure cuffs at validatebp.org

## 2022-12-31 NOTE — Assessment & Plan Note (Signed)
Assessment: BP is controlled in office BP 127/84 mmHg; diastolic above the goal (<130/80). Tolerates lisinopril and amlodipine well without any side effects Denies SOB, palpitation, chest pain, headaches,or swelling Reiterated the importance of regular exercise and low salt diet   Plan:  Continue taking current medications Patient to keep record of BP readings (2-3 days per week) with heart rate and report to [place] at the next visit Patient to follow up with Dr. Lynnette Caffey in [place] - or call sooner if BP readings become elevated again  Labs ordered today:  none

## 2022-12-31 NOTE — Progress Notes (Signed)
Office Visit    Patient Name: Stephanie Dougherty Date of Encounter: 12/31/2022  Primary Care Provider:  System, Provider Not In Primary Cardiologist:  None  Chief Complaint    Hypertension  Significant Past Medical History   Pre-diabetes 10/21 A1c 5.7, was previously on metformin, followed by PCP  hyperlipidemia 7/22 LDL 181, no meds, followed by PCP?  palpitations 7 day monitor showed sinus rhythm, even in pt triggered events  Precordial pain Per Dr. Lynnette Caffey is positional in nature, not exertional, likely musculoskeletal       Allergies  Allergen Reactions   Losartan Palpitations    History of Present Illness    Stephanie Dougherty is a 52 y.o. female patient of Dr Lynnette Caffey, in the office today to discuss hypertension.  She was seen by Dr. Lynnette Caffey last October, with a BP of 160/80.  At that time the chart indicated she was taking telmisartan hctz.  Dr. Lynnette Caffey added amlodipine 10 mg.  In January she reached out to the office to complain of lower extremity edema, so the amlodipine was stopped and nebivolol 5 mg daily was started.  At that time she also noted the temlisartan hctz made her feel poorly, so she stopped it as well.  Another call in early February noted that she wanted to stop the nebivolol (she did not think it was working) and go back on amlodipine.  She was told to continue with the nebivolol and scheduled to see PharmD clinic for follow up.  I saw her in late February and we put her on amlodipine 2.5 mg and lisinopril 10 mg.  As she was uninsured at the time, we set her follow up for 3 months.   Since then she has worked with our Child psychotherapist and is now in a Archivist.   Today she returns for follow up.  States she has been doing well and feels much better with the current combination of lisinopril and amlodipine.    127/84  Blood Pressure Goal:  130/80  Current Medications:  lisinopril 10 mg qd, amlodipine 2.5 mg qd  Previously tried:   losartan - palpitations, amlodipine 10 mg - edema  Family Hx:  both parents living, both with hypertension, father since in his 72's, now in his 68's both in 27's doing well; has 8 siblings, 4 with known hypertension,  2 sons both healthy  Social Hx:      Tobacco:no  Alcohol:no  Caffeine:previously drank coffee, now only decaf  Diet:   mostly home cooked meals, leaves salt out if cooking for herself; plenty of vegetables fresh and frozen; protein chicken mostly, some beef; not much fish or pork  Exercise: 30  minutes  - cardio, stationary bike several times per week  Home BP readings: by recall, lowest reading was 117/72, highest 150/80, states most readings were on the lower end.  Previous visit - 24 home readings average 130/79  HR 61  (range 117-170/71-86)    Accessory Clinical Findings    Lab Results  Component Value Date   CREATININE 0.62 04/30/2022   BUN 10 04/30/2022   NA 142 04/30/2022   K 3.5 04/30/2022   CL 102 04/30/2022   CO2 25 04/30/2022   Lab Results  Component Value Date   ALT 26 03/02/2021   AST 24 03/02/2021   ALKPHOS 53 03/02/2021   BILITOT 0.6 03/02/2021   Lab Results  Component Value Date   HGBA1C 5.7 (H) 05/12/2020    Home Medications  Current Outpatient Medications  Medication Sig Dispense Refill   amLODipine (NORVASC) 2.5 MG tablet Take 1 tablet (2.5 mg total) by mouth daily. 90 tablet 3   Cholecalciferol (VITAMIN D3) 25 MCG (1000 UT) CAPS Take by mouth daily.     lisinopril (ZESTRIL) 10 MG tablet Take 1 tablet (10 mg total) by mouth daily. 90 tablet 3   Omega-3 Fatty Acids (OMEGA 3 500 PO) Take 1 capsule by mouth daily.     pantoprazole (PROTONIX) 40 MG tablet Take 1 tablet (40 mg total) by mouth daily. 30 tablet 3   No current facility-administered medications for this visit.        Assessment & Plan    Hypertension Assessment: BP is controlled in office BP 127/84 mmHg; diastolic above the goal (<130/80). Tolerates lisinopril and  amlodipine well without any side effects Denies SOB, palpitation, chest pain, headaches,or swelling Reiterated the importance of regular exercise and low salt diet   Plan:  Continue taking current medications Patient to keep record of BP readings (2-3 days per week) with heart rate and report to [place] at the next visit Patient to follow up with Dr. Lynnette Caffey in [place] - or call sooner if BP readings become elevated again  Labs ordered today:  none   Phillips Hay PharmD CPP [facility]  [address] [place], [place] [address] [phone]

## 2023-03-05 ENCOUNTER — Other Ambulatory Visit: Payer: Self-pay | Admitting: *Deleted

## 2023-03-05 DIAGNOSIS — I8393 Asymptomatic varicose veins of bilateral lower extremities: Secondary | ICD-10-CM

## 2023-03-28 ENCOUNTER — Ambulatory Visit (HOSPITAL_COMMUNITY)
Admission: RE | Admit: 2023-03-28 | Discharge: 2023-03-28 | Disposition: A | Discharge status: Home / Self Care | Payer: Self-pay | Source: Ambulatory Visit | Attending: Vascular Surgery | Admitting: Vascular Surgery

## 2023-03-28 ENCOUNTER — Ambulatory Visit (INDEPENDENT_AMBULATORY_CARE_PROVIDER_SITE_OTHER): Payer: Self-pay | Admitting: Physician Assistant

## 2023-03-28 VITALS — BP 162/88 | HR 77 | Temp 97.7°F | Wt 135.0 lb

## 2023-03-28 DIAGNOSIS — I8393 Asymptomatic varicose veins of bilateral lower extremities: Secondary | ICD-10-CM | POA: Insufficient documentation

## 2023-03-28 DIAGNOSIS — I872 Venous insufficiency (chronic) (peripheral): Secondary | ICD-10-CM

## 2023-03-28 NOTE — Progress Notes (Signed)
[facility] [place]   Reason for referral: Swollen left leg with varicose veins  History of Present Illness  Stephanie Dougherty is a 52 y.o. female who presents with chief complaint: swollen leg.  Patient notes, onset of swelling 3 years increased pain and edema months ago, associated with prolonged sitting and standing.  The patient has had no history of DVT, positive history of varicose vein since age 89, no history of venous stasis ulcers, no history of  Lymphedema and no history of skin changes in lower legs.  There is a family history of venous disorders.  The patient has not used medical grade compression stockings in the past.  She states her mother had vein issues as well.  She gets heavy aching in her legs and is keeps her from falling a sleep some nights.  The swelling is improved over night.      Past Medical History:  Diagnosis Date   Anxiety    Blood transfusion without reported diagnosis    Chest pain    Hyperlipidemia    Hypertension 2019   Prediabetes     Past Surgical History:  Procedure Laterality Date   ABDOMINAL HYSTERECTOMY     No oophorectomy; emergent hysterectomy for postpartum bleeding.   CHOLECYSTECTOMY  2001    Social History   Socioeconomic History   Marital status: Media planner    Spouse name: Norman Herrlich   Number of children: 2   Years of education: 6   Highest education level: Not on file  Occupational History   Occupation: Housewife  Tobacco Use   Smoking status: Never   Smokeless tobacco: Never  Vaping Use   Vaping status: Never Used  Substance and Sexual Activity   Alcohol use: No   Drug use: No   Sexual activity: Not on file  Other Topics Concern   Not on file  Social History Narrative   Lives with her female partner and 2 sons.   Social Determinants of Health   Financial Resource Strain: Medium Risk (10/03/2022)   Overall Financial Resource Strain (CARDIA)    Difficulty of Paying Living Expenses:  Somewhat hard  Food Insecurity: No Food Insecurity (10/03/2022)   Hunger Vital Sign    Worried About Running Out of Food in the Last Year: Never true    Ran Out of Food in the Last Year: Never true  Transportation Needs: No Transportation Needs (10/03/2022)   PRAPARE - Administrator, Civil Service (Medical): No    Lack of Transportation (Non-Medical): No  Physical Activity: Sufficiently Active (04/20/2019)   Exercise Vital Sign    Days of Exercise per Week: 7 days    Minutes of Exercise per Session: 30 min  Stress: Not on file  Social Connections: Unknown (10/17/2022)   Received from [facility]   Social Network    Social Network: Not on file  Intimate Partner Violence: Unknown (10/17/2022)   Received from [facility]   HITS    Physically Hurt: Not on file    Insult or Talk Down To: Not on file    Threaten Physical Harm: Not on file    Scream or Curse: Not on file    Family History  Problem Relation Age of Onset   Hypertension Mother    Diabetes Father    Hypertension Father    Lung disease Sister    Deep vein thrombosis Sister    Diabetes Brother    Diabetes Brother  Celiac disease Brother    Hiatal hernia Brother    Diabetes Brother    Obesity Son    Obesity Son     Current Outpatient Medications on File Prior to Visit  Medication Sig Dispense Refill   amLODipine (NORVASC) 2.5 MG tablet Take 1 tablet (2.5 mg total) by mouth daily. 90 tablet 3   Cholecalciferol (VITAMIN D3) 25 MCG (1000 UT) CAPS Take by mouth daily.     lisinopril (ZESTRIL) 10 MG tablet Take 1 tablet (10 mg total) by mouth daily. 90 tablet 3   Omega-3 Fatty Acids (OMEGA 3 500 PO) Take 1 capsule by mouth daily.     pantoprazole (PROTONIX) 40 MG tablet Take 1 tablet (40 mg total) by mouth daily. 30 tablet 3   No current facility-administered medications on file prior to visit.    Allergies as of 03/28/2023 - Review Complete 03/28/2023  Allergen Reaction Noted   Losartan Palpitations  01/06/2019     ROS:   General:  No weight loss, Fever, chills  HEENT: No recent headaches, no nasal bleeding, no visual changes, no sore throat  Neurologic: No dizziness, blackouts, seizures. No recent symptoms of stroke or mini- stroke. No recent episodes of slurred speech, or temporary blindness.  Cardiac: No recent episodes of chest pain/pressure, no shortness of breath at rest.  No shortness of breath with exertion.  Denies history of atrial fibrillation or irregular heartbeat  Vascular: No history of rest pain in feet.  No history of claudication.  No history of non-healing ulcer, No history of DVT   Pulmonary: No home oxygen, no productive cough, no hemoptysis,  No asthma or wheezing  Musculoskeletal:  [ ]  Arthritis, [ ]  Low back pain,  [ ]  Joint pain  Hematologic:No history of hypercoagulable state.  No history of easy bleeding.  No history of anemia  Gastrointestinal: No hematochezia or melena,  No gastroesophageal reflux, no trouble swallowing  Urinary: [ ]  chronic Kidney disease, [ ]  on HD - [ ]  MWF or [ ]  TTHS, [ ]  Burning with urination, [ ]  Frequent urination, [ ]  Difficulty urinating;   Skin: No rashes  Psychological: No history of anxiety,  No history of depression  Physical Examination  Vitals:   03/28/23 1436  BP: (!) 162/88  Pulse: 77  Temp: 97.7 F (36.5 C)  TempSrc: Temporal  SpO2: 98%  Weight: 135 lb (61.2 kg)    Body mass index is 27.22 kg/m.  General:  Alert and oriented, no acute distress HEENT: Normal Neck: No bruit or JVD Pulmonary: Clear to auscultation bilaterally Cardiac: Regular Rate and Rhythm without murmur Abdomen: Soft, non-tender, non-distended, no mass, no scars Skin: No rash Extremity Pulses:  2+ radial,  femoral, dorsalis pedis,pulses bilaterally Musculoskeletal: positive left > right edema  Neurologic: Upper and lower extremity motor 5/5 and  symmetric  DATA:  +--------------+---------+------+-----------+------------+-----------------  ----+  LEFT         Reflux NoRefluxReflux TimeDiameter cmsComments                                        Yes                                                 +--------------+---------+------+-----------+------------+-----------------  ----+  CFV  no                                                            +--------------+---------+------+-----------+------------+-----------------  ----+  FV prox       no                                                            +--------------+---------+------+-----------+------------+-----------------  ----+  FV mid        no                                                            +--------------+---------+------+-----------+------------+-----------------  ----+  FV dist       no                                                            +--------------+---------+------+-----------+------------+-----------------  ----+  Popliteal    no                                                            +--------------+---------+------+-----------+------------+-----------------  ----+  GSV at [facility]              yes    >500 ms     0.743                            +--------------+---------+------+-----------+------------+-----------------  ----+  GSV prox thigh                 >500 ms     0.411                            +--------------+---------+------+-----------+------------+-----------------  ----+  GSV mid thigh no                           0.269    varicosed vein                                                              travels  posterior  medially                +--------------+---------+------+-----------+------------+-----------------  ----+  GSV dist thigh          yes    >500 ms      0.474                            +--------------+---------+------+-----------+------------+-----------------  ----+  GSV at knee             yes    >500 ms     0.397                            +--------------+---------+------+-----------+------------+-----------------  ----+  GSV prox calf           yes    >500 ms     0.155                            +--------------+---------+------+-----------+------------+-----------------  ----+  SSV Pop Fossa no                           0.255                            +--------------+---------+------+-----------+------------+-----------------  ----+  SSV prox calf no                           0.104                            +--------------+---------+------+-----------+------------+-----------------  ----+  SSV mid calf  no                           0.099                            +--------------+---------+------+-----------+------------+-----------------  ----+     Summary:  Left:  - No evidence of deep vein thrombosis seen in the left lower extremity,  from the common femoral through the popliteal veins.   - No evidence of deep vein reflux.   - Superficial vein reflux in the SFJ and GSV.    Assessment/Plan: Venous reflux with varicose veins and symptoms of heavy aching that has progressed over the past 3 years.  She has had varicose veins since age 77.  She denies weeping and non healing wounds.    She has reflux at the [facility] with enlarged vein > 0.8 multiple areas of reflux in the GSV > 0.4.  She was placed in thigh high compression to wear daily, elevation alternating with exercise.  She will return in 3 months to be considered for intervention to include laser ablation.  Pictures were lost to technical error.    She has palpable pedal pulse and is not at risk of limb loss.    Mosetta Pigeon PA-C [facility] [place] Office: [phone]  MD in [place]

## 2023-04-12 IMAGING — DX DG LUMBAR SPINE COMPLETE 4+V
4 series · 4 of 4 positions shown · non-contrast
Comparison: None.

CLINICAL DATA: Low back pain 3-4 months

EXAM:
LUMBAR SPINE - COMPLETE 4+ VIEW

[l-spine ap]
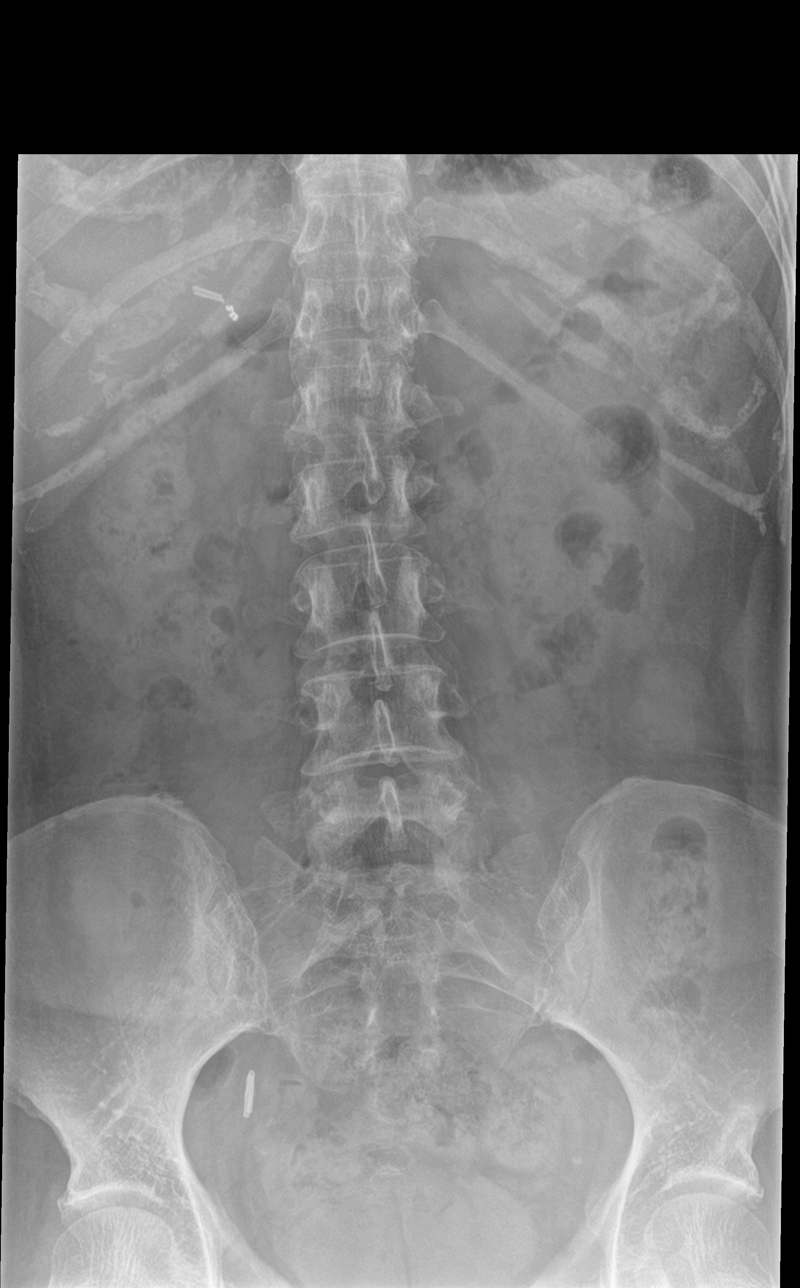

[l-spine obl (1 of 2)]
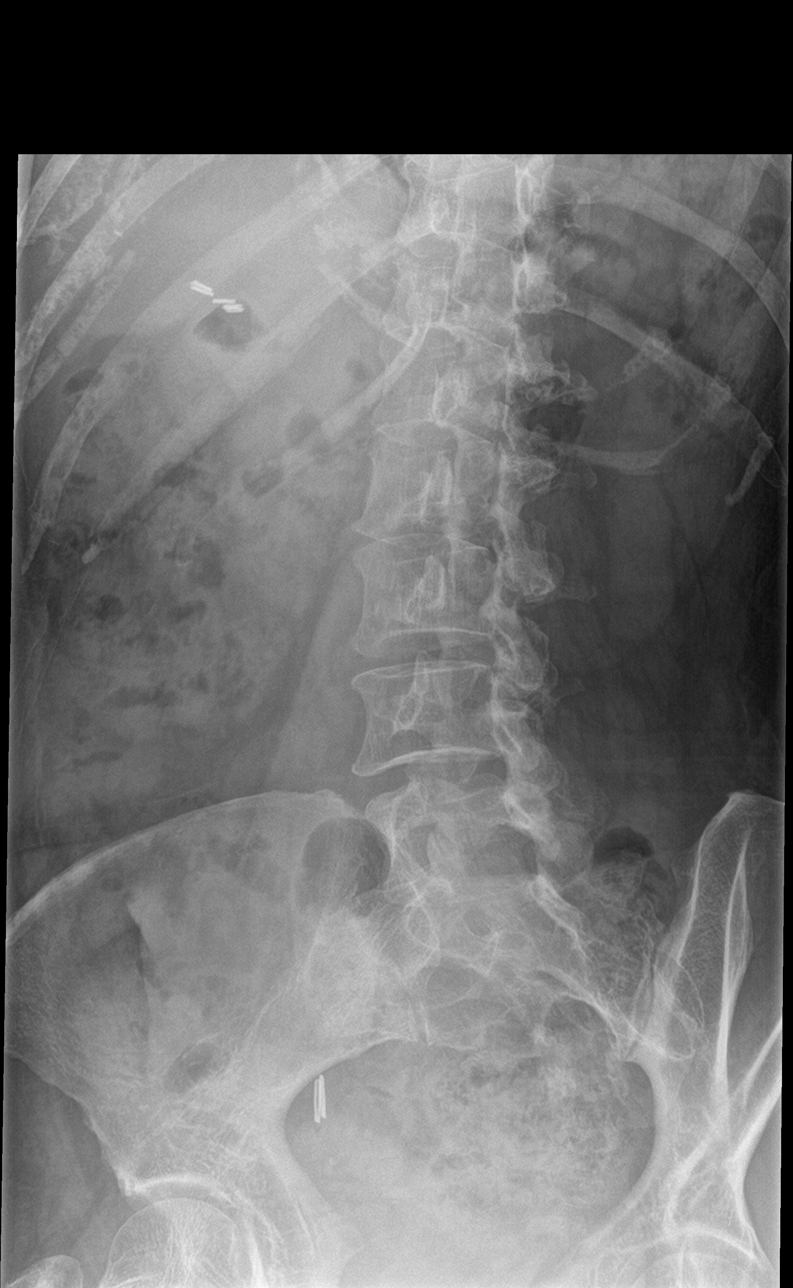

[l-spine obl (2 of 2)]
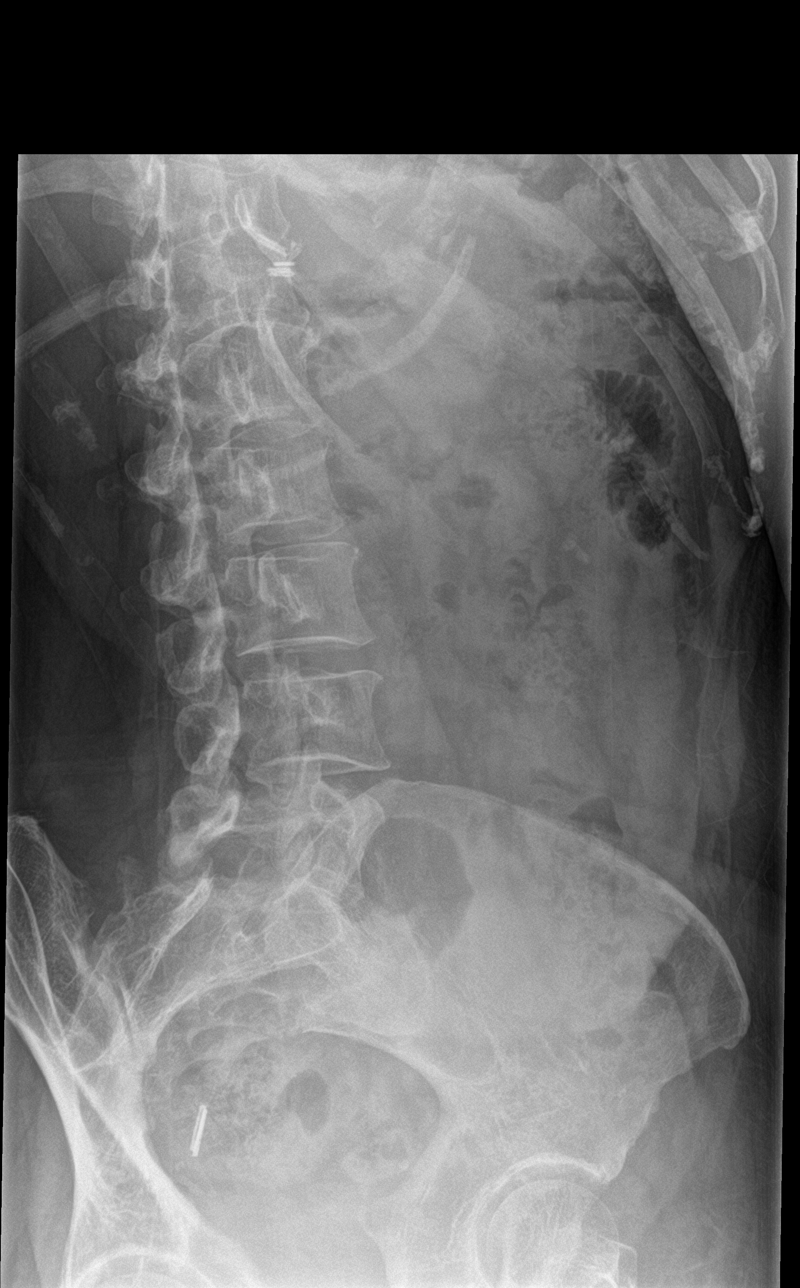

[l-spine lat]
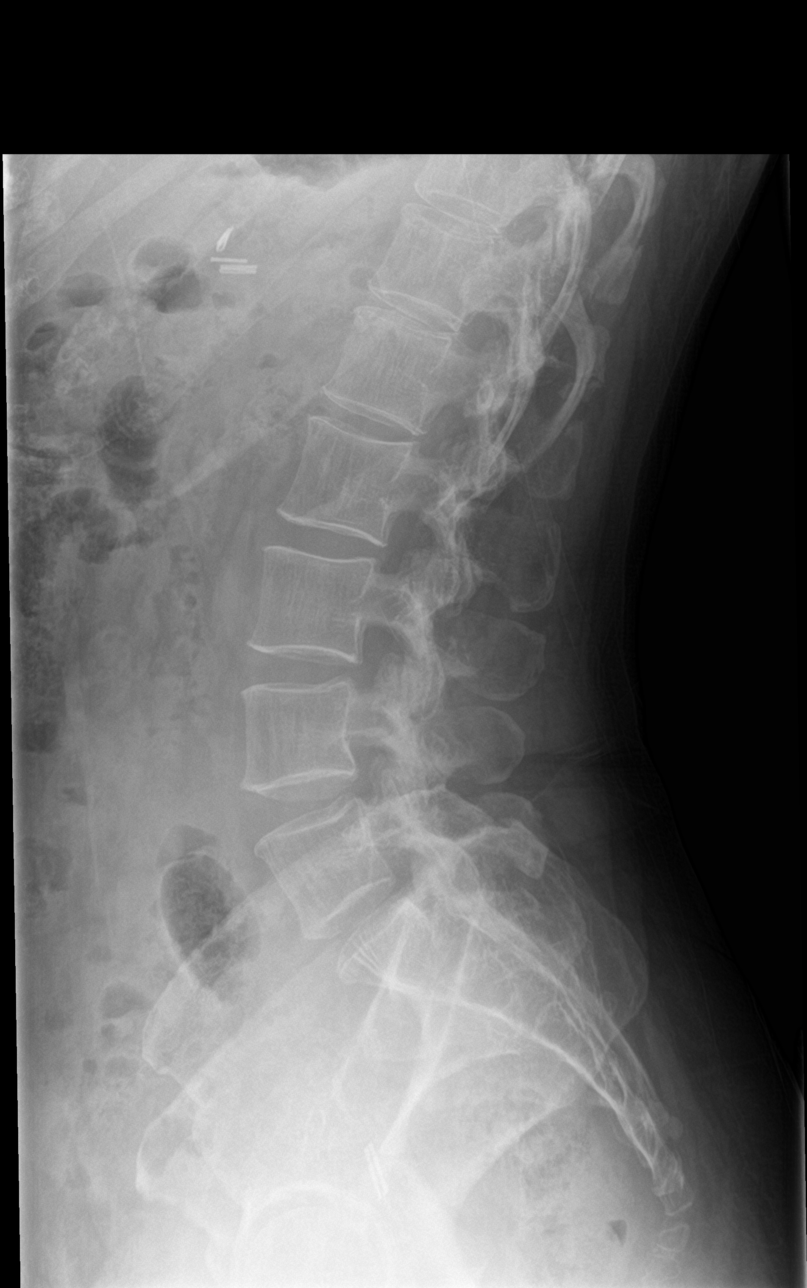

[4 of 4 positions shown; findings below may reference images not displayed]

FINDINGS: There is no evidence of lumbar spine fracture. Alignment is normal.
Intervertebral disc spaces are maintained.
IMPRESSION: Negative.

## 2023-04-15 ENCOUNTER — Other Ambulatory Visit: Payer: Self-pay

## 2023-04-15 DIAGNOSIS — I872 Venous insufficiency (chronic) (peripheral): Secondary | ICD-10-CM

## 2023-06-13 ENCOUNTER — Telehealth: Payer: Self-pay | Admitting: Internal Medicine

## 2023-06-13 MED ORDER — LISINOPRIL 10 MG PO TABS: 10.0000 mg | ORAL_TABLET | Freq: Every day | ORAL | 0 refills | Status: DC

## 2023-06-13 MED ORDER — AMLODIPINE BESYLATE 2.5 MG PO TABS: 2.5000 mg | ORAL_TABLET | Freq: Every day | ORAL | 0 refills | Status: DC

## 2023-06-13 NOTE — Telephone Encounter (Signed)
*  STAT* If patient is at the pharmacy, call can be transferred to refill team.   1. Which medications need to be refilled? (please list name of each medication and dose if known) amLODipine (NORVASC) 2.5 MG tablet  lisinopril (ZESTRIL) 10 MG tablet   2. Which pharmacy/location (including street and city if local pharmacy) is medication to be sent to? [facility] - [place] ([place]), [place] - [address]   3. Do they need a 30 day or 90 day supply? 90

## 2023-06-24 ENCOUNTER — Telehealth: Payer: Self-pay | Admitting: Licensed Clinical Social Worker

## 2023-06-24 NOTE — Telephone Encounter (Signed)
H&V Care Navigation CSW Progress Note  Clinical Social Worker  mailed pt new copy of [employer] and my card  to home address. Pt previous CAFA expired on 04/20/23. We have her scheduled for upcoming appts with imaging, will need to reapply to have these assisted with- can be retroactively.   Patient is participating in a Managed Medicaid Plan:  No, self pay only  SDOH Screenings   Food Insecurity: No Food Insecurity (10/03/2022)  Housing: Low Risk  (10/03/2022)  Transportation Needs: No Transportation Needs (10/03/2022)  Utilities: Not At Risk (10/03/2022)  Financial Resource Strain: Medium Risk (10/03/2022)  Physical Activity: Sufficiently Active (04/20/2019)  Social Connections: Unknown (10/17/2022)   Received from [facility], [facility]  Tobacco Use: Low Risk  (03/28/2023)    Octavio Graves, MSW, LCSW Clinical Social Worker II [facility] Heart/Vascular Care Navigation  [phone]- work cell phone (preferred) [phone]- desk phone

## 2023-07-01 ENCOUNTER — Telehealth: Payer: Self-pay | Admitting: Licensed Clinical Social Worker

## 2023-07-01 NOTE — Telephone Encounter (Signed)
H&V Care Navigation CSW Progress Note  Clinical Social Worker contacted patient by phone to f/u on [employer] renewal sent to pt on 11/19. Was able to reach her this morning at [phone] with assistance of Spanish language interpreter Mikle Bosworth [phone]. Confirmed she received applications, understands to tell [facility] tomorrow that she has re-applied for assistance. She knows where and what to send in. No questions today, will f/u next week to see if any issues submitting.   Patient is participating in a Managed Medicaid Plan:  No, self pay only, CAFA renewal pending.  SDOH Screenings   Food Insecurity: No Food Insecurity (10/03/2022)  Housing: Low Risk  (10/03/2022)  Transportation Needs: No Transportation Needs (10/03/2022)  Utilities: Not At Risk (10/03/2022)  Financial Resource Strain: Medium Risk (10/03/2022)  Physical Activity: Sufficiently Active (04/20/2019)  Social Connections: Unknown (10/17/2022)   Received from [facility], [facility]  Tobacco Use: Low Risk  (03/28/2023)   Octavio Graves, MSW, LCSW Clinical Social Worker II [facility] Heart/Vascular Care Navigation  [phone]- work cell phone (preferred) [phone]- desk phone

## 2023-07-02 ENCOUNTER — Ambulatory Visit (INDEPENDENT_AMBULATORY_CARE_PROVIDER_SITE_OTHER): Payer: Self-pay | Admitting: Vascular Surgery

## 2023-07-02 ENCOUNTER — Ambulatory Visit (HOSPITAL_COMMUNITY)
Admission: RE | Admit: 2023-07-02 | Discharge: 2023-07-02 | Disposition: A | Discharge status: Home / Self Care | Payer: Self-pay | Source: Ambulatory Visit | Attending: Vascular Surgery | Admitting: Vascular Surgery

## 2023-07-02 ENCOUNTER — Encounter: Payer: Self-pay | Admitting: Vascular Surgery

## 2023-07-02 VITALS — BP 148/85 | HR 79 | Temp 98.5°F | Ht 59.6 in | Wt 137.7 lb

## 2023-07-02 DIAGNOSIS — I8393 Asymptomatic varicose veins of bilateral lower extremities: Secondary | ICD-10-CM

## 2023-07-02 DIAGNOSIS — I872 Venous insufficiency (chronic) (peripheral): Secondary | ICD-10-CM

## 2023-07-02 NOTE — Progress Notes (Signed)
Patient ID: Stephanie Dougherty, female   DOB: [birth year], 52 y.o.   MRN: [id number]  Reason for Consult: Follow-up   Referred by No ref. provider found  Subjective:     HPI:  Stephanie Dougherty is a 52 y.o. female presents with bilateral lower extremity swelling with painful varicosities.  She states that the left is greater than the right.  She denies any personal or family history of DVT.  She does not take any blood thinners.  She does have a family history of varicosities.  She states that the pain aches particularly in her left medial thigh and down to the leg and the right leg.  The knee.  She has undergone left lower extremity venous reflux testing in the past and underwent right lower extremity venous reflux testing today.  She has been compliant with bilateral thigh-high compression stockings since her initial evaluation.  Patient is alone today and all information obtained via interpreter.  Past Medical History:  Diagnosis Date   Anxiety    Blood transfusion without reported diagnosis    Chest pain    Diabetes mellitus without complication (HCC)    Hyperlipidemia    Hypertension 2019   Peripheral vascular disease (HCC)    Prediabetes    Family History  Problem Relation Age of Onset   Hypertension Mother    Diabetes Father    Hypertension Father    Lung disease Sister    Deep vein thrombosis Sister    Diabetes Brother    Diabetes Brother    Celiac disease Brother    Hiatal hernia Brother    Diabetes Brother    Obesity Son    Obesity Son    Past Surgical History:  Procedure Laterality Date   ABDOMINAL HYSTERECTOMY     No oophorectomy; emergent hysterectomy for postpartum bleeding.   CHOLECYSTECTOMY  2001    Short Social History:  Social History   Tobacco Use   Smoking status: Never   Smokeless tobacco: Never  Substance Use Topics   Alcohol use: No    Allergies  Allergen Reactions   Losartan Palpitations    Current Outpatient Medications   Medication Sig Dispense Refill   amLODipine (NORVASC) 2.5 MG tablet Take 1 tablet (2.5 mg total) by mouth daily. 30 tablet 0   Cholecalciferol (VITAMIN D3) 25 MCG (1000 UT) CAPS Take by mouth daily.     lisinopril (ZESTRIL) 10 MG tablet Take 1 tablet (10 mg total) by mouth daily. 30 tablet 0   metformin (FORTAMET) 500 MG (OSM) 24 hr tablet Take 500 mg by mouth daily with breakfast.     Omega-3 Fatty Acids (OMEGA 3 500 PO) Take 1 capsule by mouth daily.     pantoprazole (PROTONIX) 40 MG tablet Take 1 tablet (40 mg total) by mouth daily. (Patient not taking: Reported on 07/02/2023) 30 tablet 3   No current facility-administered medications for this visit.    Review of Systems  Constitutional:  Constitutional negative. HENT: HENT negative.  Eyes: Eyes negative.  Respiratory: Respiratory negative.  Cardiovascular: Positive for leg swelling.  GI: Gastrointestinal negative.  Musculoskeletal: Positive for leg pain.  Neurological: Neurological negative. Hematologic: Hematologic/lymphatic negative.  Psychiatric: Psychiatric negative.        Objective:  Objective   Vitals:   07/02/23 1533  BP: (!) 148/85  Pulse: 79  Temp: 98.5 F (36.9 C)  TempSrc: Temporal  SpO2: 95%  Weight: 137 lb 11.2 oz (62.5 kg)  Height: 4' 11.6" (1.514 m)  Body mass index is 27.25 kg/m.  Physical Exam HENT:     Head: Normocephalic.     Nose: Nose normal.  Eyes:     Pupils: Pupils are equal, round, and reactive to light.  Cardiovascular:     Rate and Rhythm: Normal rate.  Pulmonary:     Effort: Pulmonary effort is normal.  Abdominal:     General: Abdomen is flat.     Palpations: Abdomen is soft.  Musculoskeletal:     Right lower leg: Edema present.     Left lower leg: Edema present.  Skin:    General: Skin is warm.     Capillary Refill: Capillary refill takes less than 2 seconds.  Neurological:     General: No focal deficit present.     Mental Status: She is alert.  Psychiatric:         Mood and Affect: Mood normal.        Thought Content: Thought content normal.        Judgment: Judgment normal.          Data: Venous Reflux Times  +--------------+---------+------+-----------+------------+---------+  RIGHT        Reflux NoRefluxReflux TimeDiameter cmsComments                           Yes                                    +--------------+---------+------+-----------+------------+---------+  CFV                    yes   >1 second                        +--------------+---------+------+-----------+------------+---------+  FV prox                 yes   >1 second                        +--------------+---------+------+-----------+------------+---------+  FV mid        no                                               +--------------+---------+------+-----------+------------+---------+  FV dist       no                                               +--------------+---------+------+-----------+------------+---------+  Popliteal              yes   >1 second                        +--------------+---------+------+-----------+------------+---------+  GSV at SFJ              yes    >500 ms      0.64               +--------------+---------+------+-----------+------------+---------+  GSV prox thigh          yes    >500 ms      1.17               +--------------+---------+------+-----------+------------+---------+  GSV mid thigh           yes    >500 ms      0.63               +--------------+---------+------+-----------+------------+---------+  GSV dist thigh          yes    >500 ms      0.60               +--------------+---------+------+-----------+------------+---------+  GSV at knee             yes    >500 ms      0.57    branching  +--------------+---------+------+-----------+------------+---------+  GSV prox calf           yes    >500 ms      0.53    branching   +--------------+---------+------+-----------+------------+---------+  SSV Pop Fossa no                            0.29               +--------------+---------+------+-----------+------------+---------+  SSV prox calf no                            0.26               +--------------+---------+------+-----------+------------+---------+  PFV                    yes  > 1 second                        +--------------+---------+------+-----------+------------+---------+   LEFT          Reflux NoRefluxReflux TimeDiameter cmsComments                                        Yes                                                 +--------------+---------+------+-----------+------------+-----------------  ----+  CFV          no                                                            +--------------+---------+------+-----------+------------+-----------------  ----+  FV prox       no                                                            +--------------+---------+------+-----------+------------+-----------------  ----+  FV mid        no                                                            +--------------+---------+------+-----------+------------+-----------------  ----+  FV dist       no                                                            +--------------+---------+------+-----------+------------+-----------------  ----+  Popliteal    no                                                            +--------------+---------+------+-----------+------------+-----------------  ----+  GSV at [facility]              yes    >500 ms     0.743                            +--------------+---------+------+-----------+------------+-----------------  ----+  GSV prox thigh                 >500 ms     0.411                            +--------------+---------+------+-----------+------------+-----------------  ----+   GSV mid thigh no                           0.269    varicosed vein                                                              travels  posterior                                                          medially                +--------------+---------+------+-----------+------------+-----------------  ----+  GSV dist thigh          yes    >500 ms     0.474                            +--------------+---------+------+-----------+------------+-----------------  ----+  GSV at knee             yes    >500 ms     0.397                            +--------------+---------+------+-----------+------------+-----------------  ----+  GSV prox calf           yes    >500 ms     0.155                            +--------------+---------+------+-----------+------------+-----------------  ----+  SSV  Pop Fossa no                           0.255                            +--------------+---------+------+-----------+------------+-----------------  ----+  SSV prox calf no                           0.104                            +--------------+---------+------+-----------+------------+-----------------  ----+  SSV mid calf  no                           0.099                            +--------------+---------+------+-----------+------------+-----------------  ----+          Assessment/Plan:     52 year old female with C3 bilateral lower extremity venous disease with painful varicosities bilaterally particularly in the left medial thigh and medial leg as well as right medial leg where she has very large refluxing great saphenous veins.  She states that the left side is actually more painful particularly in the left medial thigh where she has aching and throbbing.  There is a very large greater saphenous vein on the left that is within the fascia from above the knee and then gives off multiple varicosities where it is somewhat  smaller but refluxing into the varicosities and again is a large above the varicosities.  This appears to be treatable cannulating above the knee with great saphenous vein ablation and she would also need greater than 20 stab phlebectomy sites of the medial thigh and medial leg.  She will continue thigh-high compression stockings until that time.  We also discussed proceeding with right lower extremity great saphenous vein ablation and greater than 20 stab phlebectomies after she recovers.  She demonstrates good understanding via interpreter today and will have Domenic Moras, RN reach out to her in the near future to schedule.  Interpreter will be necessary for phone call.     Maeola Harman MD [facility]

## 2023-07-08 NOTE — Progress Notes (Unsigned)
Cardiology Office Note:    Date:  07/09/2023  ID:  Stephanie Dougherty, DOB 14-Dec-1970, MRN [id number] PCP: Patient, No Pcp Per  [facility] Providers Cardiologist:  None       Patient Profile:      Hypertension Hyperlipidemia Prediabetes Palpitations TTE 05/31/2022: EF 60-65, no RWMA, normal RVSF, RAP 3 Monitor 05/2022: <1% PVCs, PACs; no A-fib or significant arrhythmias        History of Present Illness:  Discussed the use of AI scribe software for clinical note transcription with the patient, who gave verbal consent to proceed.  Stephanie Dougherty is a 52 y.o. female who returns for follow-up on hypertension.  She was last seen by Dr. Lynnette Caffey in October 2023 for evaluation of chest discomfort after recent visit to the emergency room.  Chest pain was felt to be noncardiac.  The patient did note palpitations.  Monitor demonstrated rare PVCs and PACs but no significant arrhythmias.  Echocardiogram demonstrated normal EF and no valvular heart disease.  The patient had lower extremity edema due to amlodipine and this was stopped.  She was seen in the Pharm.D. hypertension clinic.  She had intolerance to telmisartan/HCTZ and had also stopped this medication.  She also could not tolerate nebivolol.  She was placed back on low-dose amlodipine in addition to lisinopril.  When last seen in May 2024, her blood pressure was controlled.  She is seen today with help of an interpreter.  She had COVID 19 about 3 months ago.  Afterward she started to note that her blood pressures were running high.  She typically sees readings around 145/90 at home.  She does have symptoms of blurry vision.  She has not had shortness of breath, syncope, orthopnea, leg edema.  She continues to note left antecubital fossa discomfort from time to time that does radiate up into her chest.  This seems to be more positional.  She has not had exertional chest heaviness or pressure.          Review of Systems   Gastrointestinal:  Negative for hematochezia.  Genitourinary:  Negative for hematuria.  See HPI     Studies Reviewed:   EKG Interpretation Date/Time:  Wednesday July 09 2023 07:55:41 EST Ventricular Rate:  73 PR Interval:  152 QRS Duration:  82 QT Interval:  402 QTC Calculation: 442 R Axis:   51  Text Interpretation: Normal sinus rhythm Non-specific ST-t changes No significant change since last tracing Confirmed by Tereso Newcomer [phone]) on 07/09/2023 8:04:23 AM             Risk Assessment/Calculations:     HYPERTENSION CONTROL Vitals:   07/09/23 0736 07/09/23 0830  BP: (!) 150/84 (!) 160/100    The patient's blood pressure is elevated above target today.  In order to address the patient's elevated BP: A current anti-hypertensive medication was adjusted today.          Physical Exam:   VS:  BP (!) 160/100   Pulse 76   Ht 4' 11.6" (1.514 m)   Wt 138 lb 9.6 oz (62.9 kg)   LMP  (LMP Unknown)   SpO2 98%   BMI 27.43 kg/m    Wt Readings from Last 3 Encounters:  07/09/23 138 lb 9.6 oz (62.9 kg)  07/02/23 137 lb 11.2 oz (62.5 kg)  03/28/23 135 lb (61.2 kg)    Constitutional:      Appearance: Healthy appearance. Not in distress.  Neck:     Vascular:  No carotid bruit. JVD normal.  Pulmonary:     Breath sounds: Normal breath sounds. No wheezing. No rales.  Cardiovascular:     Normal rate. Regular rhythm.     Murmurs: There is no murmur.  Edema:    Peripheral edema absent.  Abdominal:     Palpations: Abdomen is soft.        Assessment and Plan:   Assessment & Plan Primary hypertension Blood pressure uncontrolled.  She cannot tolerate higher doses of amlodipine due to edema.  Continue amlodipine 2.5 mg daily.  Increase lisinopril to 20 mg daily.  Obtain BMET today.  Repeat BMET in 2 weeks.  She has had low normal potassium levels in the past.  If blood pressure remains uncontrolled and potassium remains low normal, consider adding spironolactone.  Low Na  diet. Follow-up in 4 to 6 weeks. Precordial pain Chest pain is noncardiac.  This seems to be related to her elbow.  She gets relief with a compression sleeve.  No cardiac workup needed at this time.  Continue follow-up with primary care for further management.        Dispo:  Return in about 4 weeks (around 08/06/2023) for Routine Follow Up, w/ Tereso Newcomer, PA-C.  Signed, Tereso Newcomer, PA-C

## 2023-07-09 ENCOUNTER — Ambulatory Visit: Payer: Self-pay | Attending: Physician Assistant | Admitting: Physician Assistant

## 2023-07-09 ENCOUNTER — Other Ambulatory Visit: Payer: Self-pay | Admitting: *Deleted

## 2023-07-09 ENCOUNTER — Encounter: Payer: Self-pay | Admitting: Physician Assistant

## 2023-07-09 VITALS — BP 160/100 | HR 76 | Ht 59.6 in | Wt 138.6 lb

## 2023-07-09 DIAGNOSIS — I10 Essential (primary) hypertension: Secondary | ICD-10-CM

## 2023-07-09 DIAGNOSIS — R072 Precordial pain: Secondary | ICD-10-CM

## 2023-07-09 MED ORDER — LISINOPRIL 20 MG PO TABS: 20.0000 mg | ORAL_TABLET | Freq: Every day | ORAL | 3 refills | Status: AC

## 2023-07-09 NOTE — Patient Instructions (Addendum)
Instrucciones de medicacin:  Su mdico le ha recomendado Sales executive siguiente cambio en su medicacin:  1. AUMENTAR Lisinopril a 20 mg tomando 1 diario  *Si necesita un reabastecimiento de sus medicamentos cardacos antes de su prxima cita, llame a su farmacia*   Trabajo de laboratorio: HOY: BMET  2 SEMANAS, VAYA A UN [facility] CERCA DE USTED PARA OBTENER: BMET  Si le han realizado anlisis de sangre (anlisis de [place]) hoy y sus pruebas son completamente normales, recibir sus resultados nicamente mediante:  Mensaje de MyChart (si tiene Clinical cytogeneticist) [place]  Una copia impresa por correo Si tiene alguna prueba de laboratorio que es anormal o necesitamos cambiar su tratamiento, lo llamaremos para revisar los [place]  Pruebas/Procedimientos: [place] Agricultural engineer un seguimiento: En [facility], usted y sus necesidades de salud son [place] prioridad.  Como parte de nuestra misin continua de brindarle una atencin cardaca excepcional, hemos creado equipos de atencin de proveedores designados.  Estos equipos de atencin incluyen a su cardilogo principal (mdico) y proveedores de Cabin crew (APP: asistentes mdicos y enfermeras practicantes), quienes trabajan juntos para brindarle la atencin que necesita, cuando la necesita.  Recomendamos registrarse en el portal para pacientes llamado "MyChart".  La informacin de registro se proporciona en este Resumen posterior a la visita.  MyChart se utiliza para conectarse con pacientes para visitas virtuales (telemedicina).  Los [employer] de las pruebas/laboratorio, notas de encuentros, prximas citas, Catering manager. Tambin se pueden enviar mensajes no urgentes a su proveedor.   Para obtener ms informacin sobre lo que [employer] con MyChart, visite [email or web address].    Tu prxima cita:   5 semana(s)  Proveedor:   Tereso Newcomer         Otras instrucciones Traiga su medidor de presin arterial a la  prxima cita. Plan de alimentacin con bajo contenido de sodio Low-Sodium Eating Plan La sal (sodio) le ayuda a Pharmacologist un equilibrio saludable de los lquidos corporales. Demasiado sodio puede aumentar la presin arterial. Tambin puede hacer que el cuerpo retenga lquidos y desechos. Su mdico o nutricionista podran recomendarle un plan de alimentacin con bajo contenido de sodio si tiene presin arterial alta (hipertensin), enfermedad renal, enfermedad heptica o insuficiencia cardaca. El consumo de una menor cantidad de sodio puede ayudar a disminuir la presin arterial y a Building services engineer. Tambin puede proteger el corazn, el hgado y los riones. Consejos para seguir Consulting civil engineer las etiquetas de los alimentos  Verifique la cantidad de sodio por porcin en las etiquetas de los alimentos. Si come ms de una porcin, debe multiplicar la cantidad indicada de sodio por la cantidad de porciones. Elija alimentos con menos de 140 miligramos (mg) de sodio por porcin. Evite consumir alimentos que tengan 300 mg de sodio o ms por porcin. Siempre controle la cantidad de sodio que contiene un producto, incluso si la etiqueta dice "sin sal" o "sin sal agregada". Al ir de compras  Compre productos en los que en su etiqueta diga: "bajo contenido de sodio" o "sin sal agregada". Compre alimentos frescos. Evite los alimentos enlatados y comidas precocidas o congeladas. Evite las carnes enlatadas, curadas o procesadas. Compre panes que tengan menos de 80 mg de sodio por rebanada. Al cocinar  Coma ms comidas caseras. Trate de comer menos comida de restaurantes, bufs y comida rpida. Trate de no agregar sal cuando cocine. Use hierbas o aderezos sin sal, en lugar de sal de mesa o sal marina. Consulte al mdico o farmacutico antes  de usar sustitutos de la sal. Cocine con aceites de origen vegetal, como el de canola, girasol u oliva. Planificacin de las comidas Cuando coma en un restaurante,  pida que preparen su comida con menos sal, o bien, sin nada de sal. Evite los platos etiquetados como en salmuera, encurtidos, curados o ahumados. Evite los platos elaborados con salsa de soja, miso o salsa teriyaki. Evite alimentos que contengan glutamato monosdico (GMS). El MSG a veces se agrega a algunas comidas de restaurante, salsas, sopas, caldos y alimentos enlatados. Prepare comidas que puedan ser [place], [place], hervidas, asadas o al vapor. Generalmente, se elaboran con menos sodio. Informacin general Trate de limitar la ingesta de sodio de 1,500 a 2,300 mg por da, o la cantidad [employer] haya indicado el mdico. Qu alimentos debo comer? Nils Pyle Frutas frescas, congeladas o enlatadas. Jugo de frutas. Verduras Verduras frescas o congeladas. Verduras enlatadas "sin sal agregada". Salsa de tomate y pastas "sin sal agregada". Jugos de tomate y verduras con bajo contenido de sodio o reducidos en sodio. Cereales Cereales con bajo contenido de sodio, como [place], arroz y trigo inflados, y trigo triturado. Galletas con bajo contenido de [place]. Arroz sin sal. Pastas sin sal. Pan con bajo contenido de sodio. Panes y pastas integrales. Carnes y [address] protenas Carne de vaca o ave, pescado y frutos de mar frescos o congelados. Estos no deben tener sal agregada. Atn y salmn enlatado con bajo contenido de [place]. Frutos secos sin sal. Lentejas, frijoles y guisantes secos, sin sal agregada. Frijoles enlatados sin sal. Huevos. Mantequillas de frutos secos sin sal. Lcteos Leche. Leche de soja. Quesos que, por naturaleza, tengan bajo contenido de sodio, por ejemplo, la ricota, la mozzarella fresca o el queso suizo. Quesos con contenido bajo o reducido de sodio. Queso crema. Yogur. Alios y condimentos Hierbas y especias frescas y secas. Aderezos sin sal. Mostaza y ktchup con bajo contenido de [place]. Aderezos para ensalada sin sodio. Mayonesa light sin sodio. Rbano picante fresco o refrigerado. Jugo  de limn. Vinagre. Otros alimentos Sopas caseras o con contenido de sodio bajo o reducido. Palomitas de maz y pretzels sin sal. Papas fritas sin sal o con bajo contenido de sal. Es posible que los productos que se enumeran ms arriba no sean todos los alimentos y las bebidas que puede consumir. Consulte a un nutricionista para obtener ms informacin. Qu alimentos debo evitar? Verduras Ch[place], verduras en escabeche y salsas. Aceitunas. Papas fritas. Anillos de cebollas. Verduras enlatadas regulares, excepto aquellas que sean con bajo contenido de sodio o reducidas en sodio. Pasta y salsa de tomate en lata comunes. Jugos comunes de tomate y de verduras. Verduras Hydrologist. Cereales Cereales instantneos para comer calientes. Mezclas para bizcochos, panqueques y rellenos de pan. Crutones. Mezclas para pastas o arroz con condimento. Envases comerciales de sopa de fideos. Macarrones con queso envasados o congelados. Galletas saladas comunes. Harina leudante. Carnes y [address] de vaca o pescado que est salada, [place], [place], condimentada o en escabeche. Carne precocinada o curada, como embutidos o panes de carne. Tocino. Jamn. Pepperoni. Perros calientes. Carne en conserva. Carne picada de buey. Cerdo salado. Cecina o charqui. Arenque en escabeche, anchoas y sardinas. Atn enlatado comn. Frutos secos con sal. Celine Mans para untar y quesos procesados. Quesos duros. Requesn. Queso azul. Queso feta. Queso en hebras. Queso cottage comn. Suero de [place]. Leche enlatada. Grasas y aceites Mantequilla con sal. Margarina comn. Mantequilla clarificada. Grasa de panceta. Alios y condimentos Sal de cebolla, sal de [place],  sal condimentada, sal de mesa y sal marina. Salsas en lata y envasadas. Salsa [place]. Salsa trtara. Salsa barbacoa. Salsa teriyaki. Salsa de soja, incluso la que tiene contenido reducido de [place]. Salsa de carne. Salsa de pescado. Salsa de [place].  Salsa rosada. Rbano picante envasado. [place] y [employer]. Saborizantes y tiernizantes para carne. Caldo en cubitos. Salsa picante. Adobos preelaborados o envasados. Aderezos para tacos preelaborados o envasados. Salsas. Aderezos comunes para ensalada. Salsa. Otros alimentos Palomitas de maz y pretzels con sal. Maz inflado y frituras de maz. Nachos y papas fritas envasadas. Sopas enlatadas o en polvo. Pizza. Pasteles y entradas congeladas. Es posible que los productos que se enumeran ms arriba no sean todos los alimentos y las bebidas que Personnel officer. Consulte a un nutricionista para obtener ms informacin. Esta informacin no tiene Theme park manager el consejo del mdico. Asegrese de hacerle al mdico cualquier pregunta que tenga. Document Revised: 08/30/2022 Document Reviewed: 08/30/2022 Elsevier Patient Education  2024 Elsevier Inc.   Medication Instructions:  Your physician has recommended you make the following change in your medication:  INCREASE Lisinopril to 20 mg taking 1 daily  *If you need a refill on your cardiac medications before your next appointment, please call your pharmacy*   Lab Work: TODAY:  BMET  2 WEEKS, GO TO A [facility] NEAR YOU FOR:  BMET  If you have labs (blood work) drawn today and your tests are completely normal, you will receive your results only by: MyChart Message (if you have MyChart) OR A paper copy in the mail If you have any lab test that is abnormal or we need to change your treatment, we will call you to review the results.   Testing/Procedures: None ordered   Follow-Up: At [facility], you and your health needs are our priority.  As part of our continuing mission to provide you with exceptional heart care, we have created designated Provider Care Teams.  These Care Teams include your primary Cardiologist (physician) and Advanced Practice Providers (APPs -  Physician Assistants and Nurse Practitioners) who all work  together to provide you with the care you need, when you need it.  We recommend signing up for the patient portal called "MyChart".  Sign up information is provided on this After Visit Summary.  MyChart is used to connect with patients for Virtual Visits (Telemedicine).  Patients are able to view lab/test results, encounter notes, upcoming appointments, etc.  Non-urgent messages can be sent to your provider as well.   To learn more about what you can do with MyChart, go to [email or web address].    Your next appointment:   5 week(s)  Provider:   Tereso Newcomer, PA-C         Other Instructions Bring your blood pressure machine with you at the next appointment   Plan de alimentacin con bajo contenido de sodio Low-Sodium Eating Plan La sal (sodio) le ayuda a Pharmacologist un equilibrio saludable de los lquidos corporales. Demasiado sodio puede aumentar la presin arterial. Tambin puede hacer que el cuerpo retenga lquidos y desechos. Su mdico o nutricionista podran recomendarle un plan de alimentacin con bajo contenido de sodio si tiene presin arterial alta (hipertensin), enfermedad renal, enfermedad heptica o insuficiencia cardaca. El consumo de una menor cantidad de sodio puede ayudar a disminuir la presin arterial y a Building services engineer. Tambin puede proteger el corazn, el hgado y los riones. Consejos para seguir Consulting civil engineer las etiquetas de los alimentos  Verifique la cantidad de [place]  por porcin en las etiquetas de los alimentos. Si come ms de una porcin, debe multiplicar la cantidad indicada de sodio por la cantidad de porciones. Elija alimentos con menos de 140 miligramos (mg) de sodio por porcin. Evite consumir alimentos que tengan 300 mg de sodio o ms por porcin. Siempre controle la cantidad de sodio que contiene un producto, incluso si la etiqueta dice "sin sal" o "sin sal agregada". Al ir de compras  Compre productos en los que en su etiqueta diga: "bajo  contenido de sodio" o "sin sal agregada". Compre alimentos frescos. Evite los alimentos enlatados y comidas precocidas o congeladas. Evite las carnes enlatadas, curadas o procesadas. Compre panes que tengan menos de 80 mg de sodio por rebanada. Al cocinar  Coma ms comidas caseras. Trate de comer menos comida de restaurantes, bufs y comida rpida. Trate de no agregar sal cuando cocine. Use hierbas o aderezos sin sal, en lugar de sal de mesa o sal marina. Consulte al mdico o farmacutico antes de usar sustitutos de la sal. Cocine con aceites de origen vegetal, como el de canola, girasol u [place]. Planificacin de las comidas Cuando coma en un restaurante, pida que preparen su comida con menos sal, o bien, sin nada de sal. Evite los platos etiquetados como en salmuera, encurtidos, curados o ahumados. Evite los platos elaborados con salsa de soja, miso o salsa teriyaki. Evite alimentos que contengan glutamato monosdico (GMS). El MSG a veces se agrega a algunas comidas de restaurante, salsas, sopas, caldos y alimentos enlatados. Prepare comidas que puedan ser [place], [place], hervidas, asadas o al vapor. Generalmente, se elaboran con menos sodio. Informacin general Trate de limitar la ingesta de sodio de 1,500 a 2,300 mg por da, o la cantidad [employer] haya indicado el mdico. Qu alimentos debo comer? Nils Pyle Frutas frescas, congeladas o enlatadas. Jugo de frutas. Verduras Verduras frescas o congeladas. Verduras enlatadas "sin sal agregada". Salsa de tomate y pastas "sin sal agregada". Jugos de tomate y verduras con bajo contenido de sodio o reducidos en sodio. Cereales Cereales con bajo contenido de sodio, como [place], arroz y trigo inflados, y trigo triturado. Galletas con bajo contenido de [place]. Arroz sin sal. Pastas sin sal. Pan con bajo contenido de sodio. Panes y pastas integrales. Carnes y [address] protenas Carne de vaca o ave, pescado y frutos de mar frescos o congelados. Estos no  deben tener sal agregada. Atn y salmn enlatado con bajo contenido de [place]. Frutos secos sin sal. Lentejas, frijoles y guisantes secos, sin sal agregada. Frijoles enlatados sin sal. Huevos. Mantequillas de frutos secos sin sal. Lcteos Leche. Leche de soja. Quesos que, por naturaleza, tengan bajo contenido de sodio, por ejemplo, la ricota, la mozzarella fresca o el queso suizo. Quesos con contenido bajo o reducido de sodio. Queso crema. Yogur. Alios y condimentos Hierbas y especias frescas y secas. Aderezos sin sal. Mostaza y ktchup con bajo contenido de [place]. Aderezos para ensalada sin sodio. Mayonesa light sin sodio. Rbano picante fresco o refrigerado. Jugo de limn. Vinagre. Otros alimentos Sopas caseras o con contenido de sodio bajo o reducido. Palomitas de maz y pretzels sin sal. Papas fritas sin sal o con bajo contenido de sal. Es posible que los productos que se enumeran ms arriba no sean todos los alimentos y las bebidas que puede consumir. Consulte a un nutricionista para obtener ms informacin. Qu alimentos debo evitar? Verduras Ch[place], verduras en escabeche y salsas. Aceitunas. Papas fritas. Anillos de cebollas. Verduras enlatadas regulares, excepto aquellas que sean con  bajo contenido de sodio o reducidas en sodio. Pasta y salsa de tomate en lata comunes. Jugos comunes de tomate y de verduras. Verduras Hydrologist. Cereales Cereales instantneos para comer calientes. Mezclas para bizcochos, panqueques y rellenos de pan. Crutones. Mezclas para pastas o arroz con condimento. Envases comerciales de sopa de fideos. Macarrones con queso envasados o congelados. Galletas saladas comunes. Harina leudante. Carnes y [address] de vaca o pescado que est salada, [place], [place], condimentada o en escabeche. Carne precocinada o curada, como embutidos o panes de carne. Tocino. Jamn. Pepperoni. Perros calientes. Carne en conserva. Carne picada de buey. Cerdo salado.  Cecina o charqui. Arenque en escabeche, anchoas y sardinas. Atn enlatado comn. Frutos secos con sal. Celine Mans para untar y quesos procesados. Quesos duros. Requesn. Queso azul. Queso feta. Queso en hebras. Queso cottage comn. Suero de [place]. Leche enlatada. Grasas y aceites Mantequilla con sal. Margarina comn. Mantequilla clarificada. Grasa de panceta. Alios y condimentos Sal de cebolla, sal de ajo, sal condimentada, sal de mesa y sal marina. Salsas en lata y envasadas. Salsa [place]. Salsa trtara. Salsa barbacoa. Salsa teriyaki. Salsa de soja, incluso la que tiene contenido reducido de [place]. Salsa de carne. Salsa de pescado. Salsa de [place]. Salsa rosada. Rbano picante envasado. [place] y [employer]. Saborizantes y tiernizantes para carne. Caldo en cubitos. Salsa picante. Adobos preelaborados o envasados. Aderezos para tacos preelaborados o envasados. Salsas. Aderezos comunes para ensalada. Salsa. Otros alimentos Palomitas de maz y pretzels con sal. Maz inflado y frituras de maz. Nachos y papas fritas envasadas. Sopas enlatadas o en polvo. Pizza. Pasteles y entradas congeladas. Es posible que los productos que se enumeran ms arriba no sean todos los alimentos y las bebidas que Personnel officer. Consulte a un nutricionista para obtener ms informacin. Esta informacin no tiene Theme park manager el consejo del mdico. Asegrese de hacerle al mdico cualquier pregunta que tenga. Document Revised: 08/30/2022 Document Reviewed: 08/30/2022 Elsevier Patient Education  2024 [employer].

## 2023-07-09 NOTE — Assessment & Plan Note (Signed)
Blood pressure uncontrolled.  She cannot tolerate higher doses of amlodipine due to edema.  Continue amlodipine 2.5 mg daily.  Increase lisinopril to 20 mg daily.  Obtain BMET today.  Repeat BMET in 2 weeks.  She has had low normal potassium levels in the past.  If blood pressure remains uncontrolled and potassium remains low normal, consider adding spironolactone.  Low Na diet. Follow-up in 4 to 6 weeks.

## 2023-07-10 LAB — BASIC METABOLIC PANEL
BUN/Creatinine Ratio: 20 (ref 9–23)
BUN: 11 mg/dL (ref 6–24)
CO2: 24 mmol/L (ref 20–29)
Calcium: 9.4 mg/dL (ref 8.7–10.2)
Chloride: 101 mmol/L (ref 96–106)
Creatinine, Ser: 0.56 mg/dL — ABNORMAL LOW (ref 0.57–1.00)
Glucose: 128 mg/dL — ABNORMAL HIGH (ref 70–99)
Potassium: 3.7 mmol/L (ref 3.5–5.2)
Sodium: 140 mmol/L (ref 134–144)
eGFR: 111 mL/min/{1.73_m2} (ref >59)

## 2023-07-14 ENCOUNTER — Telehealth: Payer: Self-pay | Admitting: Licensed Clinical Social Worker

## 2023-07-14 NOTE — Telephone Encounter (Signed)
H&V Care Navigation CSW Progress Note  Clinical Social Worker contacted patient by phone to f/u on assistance application renewal. Reached her today at [phone] with Spanish language interpreter Ashok Cordia [phone]. Confirmed she sent it in last Tuesday, updated her about some delays in processing. Will review chart for any updates next week.  Patient is participating in a Managed Medicaid Plan:  No, self pay, working on [employer] renewal  SDOH Screenings   Food Insecurity: No Food Insecurity (10/03/2022)  Housing: Low Risk  (10/03/2022)  Transportation Needs: No Transportation Needs (10/03/2022)  Utilities: Not At Risk (10/03/2022)  Financial Resource Strain: Medium Risk (10/03/2022)  Physical Activity: Sufficiently Active (04/20/2019)  Social Connections: Unknown (10/17/2022)   Received from [facility]  Tobacco Use: Low Risk  (07/09/2023)    Octavio Graves, MSW, LCSW Clinical Social Worker II [facility] Heart[facility]Vascular Care Navigation  [phone]- work cell phone (preferred) [phone]- desk phone

## 2023-07-21 ENCOUNTER — Ambulatory Visit (HOSPITAL_COMMUNITY)
Admission: EM | Admit: 2023-07-21 | Discharge: 2023-07-21 | Disposition: A | Discharge status: Home / Self Care | Payer: Self-pay | Attending: Physician Assistant | Admitting: Physician Assistant

## 2023-07-21 ENCOUNTER — Encounter (HOSPITAL_COMMUNITY): Payer: Self-pay | Admitting: Emergency Medicine

## 2023-07-21 ENCOUNTER — Telehealth: Payer: Self-pay | Admitting: Licensed Clinical Social Worker

## 2023-07-21 DIAGNOSIS — M79661 Pain in right lower leg: Secondary | ICD-10-CM

## 2023-07-21 NOTE — Telephone Encounter (Signed)
H&V Care Navigation CSW Progress Note  Clinical Social Worker  mailed pt approval letter for [employer] .  PATIENT APPROVED FOR 100% FINANCIAL ASSISTANCE PER CAFA APPLICATION AND SUPPORTING DOCUMENTATION. CAFA APP SCANNED TO ACCOUNT [id number] APPROVAL DATES OF [employer] ---- 06/30/23 - 12/28/23 APPROVAL LETTER ON ACCOUNT [id number]   Patient is participating in a Managed Medicaid Plan: No, self pay, CAFA only  SDOH Screenings   Food Insecurity: No Food Insecurity (10/03/2022)  Housing: Low Risk  (10/03/2022)  Transportation Needs: No Transportation Needs (10/03/2022)  Utilities: Not At Risk (10/03/2022)  Financial Resource Strain: Medium Risk (10/03/2022)  Physical Activity: Sufficiently Active (04/20/2019)  Social Connections: Unknown (10/17/2022)   Received from [facility]  Tobacco Use: Low Risk  (07/09/2023)    Stephanie Dougherty, MSW, LCSW Clinical Social Worker II [facility] Heart/Vascular Care Navigation  [phone]- work cell phone (preferred) [phone]- desk phone

## 2023-07-21 NOTE — ED Triage Notes (Signed)
Pt c/o left side calf pain that began Friday night

## 2023-07-21 NOTE — Discharge Instructions (Signed)
Recommend Ibuprofen, heat, stretching You will have an ultrasound tomorrow.  If you have not heard from someone with a  time please call.

## 2023-07-21 NOTE — ED Notes (Signed)
Stressed the importance of instructions, given a contact number if any issues.

## 2023-07-21 NOTE — ED Provider Notes (Signed)
[facility]    CSN: [id number] Arrival date & time: 07/21/23  1631      History   Chief Complaint Chief Complaint  Patient presents with   calf pain    HPI Stephanie Dougherty is a 52 y.o. female.   Patient complains of right calf pain that started several days ago.  She denies injury or trauma.  She reports pain is worse with movement and with touching.  She denies warmth, swelling, redness.  She does follow with vascular surgeon.  Bilateral varicose veins, typically left leg hurts worse than right.  She was seen by vascular surgeon in November, discussed ablation at that time, pending scheduling.  She has tried nothing for the symptoms.    Past Medical History:  Diagnosis Date   Anxiety    Blood transfusion without reported diagnosis    Chest pain    Diabetes mellitus without complication (HCC)    Hyperlipidemia    Hypertension 2019   Peripheral vascular disease (HCC)    Prediabetes     Patient Active Problem List   Diagnosis Date Noted   Labial skin tag 04/20/2019   Prediabetes    Varicose veins of both lower extremities without ulcer or inflammation 01/06/2019   Low serum vitamin D 12/23/2018   Hyperglycemia    Hyperlipidemia    Hypertension 08/05/2017    Past Surgical History:  Procedure Laterality Date   ABDOMINAL HYSTERECTOMY     No oophorectomy; emergent hysterectomy for postpartum bleeding.   CHOLECYSTECTOMY  2001    OB History     Gravida  2   Para  2   Term  2   Preterm      AB      Living  2      SAB      IAB      Ectopic      Multiple      Live Births  2            Home Medications    Prior to Admission medications   Medication Sig Start Date End Date Taking? Authorizing Provider  amLODipine (NORVASC) 2.5 MG tablet Take 1 tablet (2.5 mg total) by mouth daily. 06/13/23   Orbie Pyo, MD  Cholecalciferol (VITAMIN D3) 25 MCG (1000 UT) CAPS Take by mouth daily.    [provider]  lisinopril  (ZESTRIL) 20 MG tablet Take 1 tablet (20 mg total) by mouth daily. 07/09/23 10/07/23  Tereso Newcomer T, PA-C  metformin (FORTAMET) 500 MG (OSM) 24 hr tablet Take 500 mg by mouth daily with breakfast.    [provider]  pantoprazole (PROTONIX) 40 MG tablet Take 40 mg by mouth as needed (for indigestion heartburn acid reflux).    [provider]    Family History Family History  Problem Relation Age of Onset   Hypertension Mother    Diabetes Father    Hypertension Father    Lung disease Sister    Deep vein thrombosis Sister    Diabetes Brother    Diabetes Brother    Celiac disease Brother    Hiatal hernia Brother    Diabetes Brother    Obesity Son    Obesity Son     Social History Social History   Tobacco Use   Smoking status: Never   Smokeless tobacco: Never  Vaping Use   Vaping status: Never Used  Substance Use Topics   Alcohol use: No   Drug use: No  Allergies   Losartan   Review of Systems Review of Systems  Constitutional:  Negative for chills and fever.  HENT:  Negative for ear pain and sore throat.   Eyes:  Negative for pain and visual disturbance.  Respiratory:  Negative for cough and shortness of breath.   Cardiovascular:  Negative for chest pain and palpitations.  Gastrointestinal:  Negative for abdominal pain and vomiting.  Genitourinary:  Negative for dysuria and hematuria.  Musculoskeletal:  Positive for myalgias. Negative for arthralgias and back pain.  Skin:  Negative for color change and rash.  Neurological:  Negative for seizures and syncope.  All other systems reviewed and are negative.    Physical Exam Triage Vital Signs ED Triage Vitals  Encounter Vitals Group     BP 07/21/23 1711 (!) 171/90     Systolic BP Percentile --      Diastolic BP Percentile --      Pulse Rate 07/21/23 1711 78     Resp 07/21/23 1711 16     Temp 07/21/23 1711 98.2 F (36.8 C)     Temp Source 07/21/23 1711 Oral     SpO2 07/21/23 1711 98 %      Weight --      Height --      Head Circumference --      Peak Flow --      Pain Score 07/21/23 1710 8     Pain Loc --      Pain Education --      Exclude from Growth Chart --    No data found.  Updated Vital Signs BP (!) 171/90 (BP Location: Right Arm)   Pulse 78   Temp 98.2 F (36.8 C) (Oral)   Resp 16   LMP  (LMP Unknown)   SpO2 98%   Visual Acuity Right Eye Distance:   Left Eye Distance:   Bilateral Distance:    Right Eye Near:   Left Eye Near:    Bilateral Near:     Physical Exam Vitals and nursing note reviewed.  Constitutional:      General: She is not in acute distress.    Appearance: She is well-developed.  HENT:     Head: Normocephalic and atraumatic.  Eyes:     Conjunctiva/sclera: Conjunctivae normal.  Cardiovascular:     Rate and Rhythm: Normal rate and regular rhythm.     Heart sounds: No murmur heard. Pulmonary:     Effort: Pulmonary effort is normal. No respiratory distress.     Breath sounds: Normal breath sounds.  Abdominal:     Palpations: Abdomen is soft.     Tenderness: There is no abdominal tenderness.  Musculoskeletal:        General: No swelling.     Cervical back: Neck supple.     Comments: Right calf without redness warmth or swelling.  Negative Homans.  Skin:    General: Skin is warm and dry.     Capillary Refill: Capillary refill takes less than 2 seconds.  Neurological:     Mental Status: She is alert.  Psychiatric:        Mood and Affect: Mood normal.      UC Treatments / Results  Labs (all labs ordered are listed, but only abnormal results are displayed) Labs Reviewed - No data to display  EKG   Radiology No results found.  Procedures Procedures (including critical care time)  Medications Ordered in UC Medications - No data to display  Initial Impression /  Assessment and Plan / UC Course  I have reviewed the triage vital signs and the nursing notes.  Pertinent labs & imaging results that were available  during my care of the patient were reviewed by me and considered in my medical decision making (see chart for details).     Right calf pain.  Suspicion for DVT is low.  Will order outpatient ultrasound tomorrow.  Likely musculoskeletal in nature.  Supportive care discussed.  Advise follow-up with vascular surgeon. Final Clinical Impressions(s) / UC Diagnoses   Final diagnoses:  Pain of right calf     Discharge Instructions      Recommend Ibuprofen, heat, stretching You will have an ultrasound tomorrow.  If you have not heard from someone with a  time please call.    ED Prescriptions   None    PDMP not reviewed this encounter.   Ward, Tylene Fantasia, PA-C 07/21/23 [phone]

## 2023-07-22 ENCOUNTER — Ambulatory Visit (HOSPITAL_COMMUNITY)
Admission: RE | Admit: 2023-07-22 | Discharge: 2023-07-22 | Disposition: A | Discharge status: Home / Self Care | Payer: Self-pay | Source: Ambulatory Visit | Attending: Physician Assistant | Admitting: Physician Assistant

## 2023-07-22 DIAGNOSIS — M79661 Pain in right lower leg: Secondary | ICD-10-CM | POA: Insufficient documentation

## 2023-07-26 LAB — BASIC METABOLIC PANEL
BUN/Creatinine Ratio: 15 (ref 9–23)
BUN: 9 mg/dL (ref 6–24)
CO2: 24 mmol/L (ref 20–29)
Calcium: 9.6 mg/dL (ref 8.7–10.2)
Chloride: 100 mmol/L (ref 96–106)
Creatinine, Ser: 0.59 mg/dL (ref 0.57–1.00)
Glucose: 108 mg/dL — ABNORMAL HIGH (ref 70–99)
Potassium: 4.4 mmol/L (ref 3.5–5.2)
Sodium: 142 mmol/L (ref 134–144)
eGFR: 110 mL/min/{1.73_m2} (ref >59)

## 2023-08-12 NOTE — Progress Notes (Signed)
 Cardiology Office Note:    Date:  08/15/2023  ID:  Stephanie Dougherty, DOB 1970/10/19, MRN [id number] PCP: No primary care provider on file.  [facility] Providers Cardiologist:  Lurena MARLA Red, MD Cardiology APP:  Lelon Glendia DASEN, PA-C       Patient Profile:      Hypertension Hyperlipidemia Prediabetes Palpitations TTE 05/31/2022: EF 60-65, no RWMA, normal RVSF, RAP 3 Monitor 05/2022: <1% PVCs, PACs; no A-fib or significant arrhythmias          History of Present Illness:  Stephanie Dougherty is a 53 y.o. female who returns for follow up of hypertension. She was last seen in 07/2023. Her BP was uncontrolled. I increased her Lisinopril  to 20 mg once daily. She cannot tolerate higher doses of amlodipine  due to edema. She also noted chest and elbow pain that was noncardiac. She was asked to follow up with primary care.  She is seen today with the help of an interpreter.  She is here alone.  She feels better since adjusting her blood pressure medications.  She brings in a list of her blood pressures.  In the last several days, her pressures have been 130s/80s or less.  She tends to have higher blood pressures in clinic.  She has not had headaches, chest pain, shortness of breath or leg edema.  ROS-See HPI     Studies Reviewed:               Risk Assessment/Calculations:     HYPERTENSION CONTROL Vitals:   08/15/23 0826 08/15/23 0844  BP: 120/80 (!) 154/90    The patient's blood pressure is elevated above target today.  In order to address the patient's elevated BP: Blood pressure will be monitored at home to determine if medication changes need to be made.; The blood pressure is usually elevated in clinic.  Blood pressures monitored at home have been optimal.          Physical Exam:   VS:  BP (!) 154/90   Pulse 76   Ht 4' 11 (1.499 m)   Wt 139 lb 9.6 oz (63.3 kg)   LMP  (LMP Unknown)   SpO2 98%   BMI 28.20 kg/m    Wt Readings from Last 3 Encounters:   08/15/23 139 lb 9.6 oz (63.3 kg)  07/09/23 138 lb 9.6 oz (62.9 kg)  07/02/23 137 lb 11.2 oz (62.5 kg)    Constitutional:      Appearance: Healthy appearance. Not in distress.  Pulmonary:     Breath sounds: Normal breath sounds. No wheezing. No rales.  Cardiovascular:     Normal rate. Regular rhythm.     Murmurs: There is no murmur.  Edema:    Peripheral edema absent.        Assessment and Plan:   Assessment & Plan Primary hypertension Blood pressure is better controlled.  She tends to have high blood pressures in clinic (whitecoat hypertension).  She is doing a great job at home limiting salt and processed foods.  She has also increased activity.  Some blood pressure readings are borderline.  Others are optimal.  Continue amlodipine  2.5 mg daily, lisinopril  20 mg daily.  Continue to monitor blood pressures at home.  If blood pressure becomes more uncontrolled, I would recommend starting with increasing lisinopril  to 40 mg daily.  Follow-up in 6 months.       Dispo:  Return in about 6 months (around 02/12/2024) for Routine Follow Up, w/ Glendia  Lelon, PA-C.  Signed, Glendia Lelon, PA-C

## 2023-08-15 ENCOUNTER — Encounter: Payer: Self-pay | Admitting: Physician Assistant

## 2023-08-15 ENCOUNTER — Ambulatory Visit: Payer: Self-pay | Attending: Physician Assistant | Admitting: Physician Assistant

## 2023-08-15 VITALS — BP 154/90 | HR 76 | Ht 59.0 in | Wt 139.6 lb

## 2023-08-15 DIAGNOSIS — I10 Essential (primary) hypertension: Secondary | ICD-10-CM

## 2023-08-15 NOTE — Assessment & Plan Note (Signed)
 Blood pressure is better controlled.  She tends to have high blood pressures in clinic (whitecoat hypertension).  She is doing a great job at home limiting salt and processed foods.  She has also increased activity.  Some blood pressure readings are borderline.  Others are optimal.  Continue amlodipine  2.5 mg daily, lisinopril  20 mg daily.  Continue to monitor blood pressures at home.  If blood pressure becomes more uncontrolled, I would recommend starting with increasing lisinopril  to 40 mg daily.  Follow-up in 6 months.

## 2023-08-15 NOTE — Patient Instructions (Addendum)
Medication Instructions:  Your physician recommends that you continue on your current medications as directed. Please refer to the Current Medication list given to you today.  *If you need a refill on your cardiac medications before your next appointment, please call your pharmacy*   Lab Work: None ordered   If you have labs (blood work) drawn today and your tests are completely normal, you will receive your results only by: MyChart Message (if you have MyChart) OR A paper copy in the mail If you have any lab test that is abnormal or we need to change your treatment, we will call you to review the results.   Testing/Procedures: None ordered    Follow-Up: At [facility], you and your health needs are our priority.  As part of our continuing mission to provide you with exceptional heart care, we have created designated Provider Care Teams.  These Care Teams include your primary Cardiologist (physician) and Advanced Practice Providers (APPs -  Physician Assistants and Nurse Practitioners) who all work together to provide you with the care you need, when you need it.  We recommend signing up for the patient portal called "MyChart".  Sign up information is provided on this After Visit Summary.  MyChart is used to connect with patients for Virtual Visits (Telemedicine).  Patients are able to view lab/test results, encounter notes, upcoming appointments, etc.  Non-urgent messages can be sent to your provider as well.   To learn more about what you can do with MyChart, go to [email or web address].    Your next appointment:   6 month(s)  Provider:   Tereso Newcomer PA-C  Other Instructions

## 2023-08-20 ENCOUNTER — Other Ambulatory Visit: Payer: Self-pay | Admitting: Obstetrics and Gynecology

## 2023-08-20 DIAGNOSIS — Z1231 Encounter for screening mammogram for malignant neoplasm of breast: Secondary | ICD-10-CM

## 2023-09-16 ENCOUNTER — Other Ambulatory Visit: Payer: Self-pay | Admitting: *Deleted

## 2023-09-16 DIAGNOSIS — I83812 Varicose veins of left lower extremities with pain: Secondary | ICD-10-CM

## 2023-09-17 ENCOUNTER — Other Ambulatory Visit: Payer: Self-pay | Admitting: *Deleted

## 2023-09-17 DIAGNOSIS — I83812 Varicose veins of left lower extremities with pain: Secondary | ICD-10-CM

## 2023-09-17 MED ORDER — LORAZEPAM 1 MG PO TABS: ORAL_TABLET | ORAL | 0 refills | Status: DC

## 2023-09-23 ENCOUNTER — Telehealth: Payer: Self-pay | Admitting: *Deleted

## 2023-09-23 NOTE — Telephone Encounter (Signed)
 Through Spanish interpreter [facility] ID # [phone]), I informed Mrs. Stephanie Dougherty that I would call her tomorrow afternoon (Feb. 19) to notify her if [facility] office would be open or closed on 09-25-23 due to inclement winter weather. Mrs. Stephanie Dougherty is currently scheduled for vein surgery on her left leg on 09-25-2023 at 3:00 PM with arrival time of 2:30 PM. Mrs. Stephanie Dougherty verbalized understanding through the Spanish interpreter.

## 2023-09-24 ENCOUNTER — Telehealth: Payer: Self-pay | Admitting: *Deleted

## 2023-09-24 NOTE — Telephone Encounter (Signed)
 Spoke with Stephanie Dougherty through [place] interpreter [employer]) ID # [phone]. Confirmed with Stephanie Dougherty to arrive tomorrow at [facility] office [address] [place] [address] on 09-25-2023 at 2:30 PM to check in for 3:00 PM appointment for vein procedures of left leg by Dr. Randie Heinz.  Verified that Stephanie Dougherty had received pre-op instructions in Spanish (via mail).  Stephanie Dougherty states she received pre-op instructions and that she has no questions.  Reminded her to pick up pre-op medication that was called into her [facility] pharmacy on [address] week.  Reviewed instructions on how to take pre-op medication. Stephanie Dougherty verbalized understanding of instructions.

## 2023-09-25 ENCOUNTER — Ambulatory Visit (INDEPENDENT_AMBULATORY_CARE_PROVIDER_SITE_OTHER): Payer: Self-pay | Admitting: Vascular Surgery

## 2023-09-25 ENCOUNTER — Encounter: Payer: Self-pay | Admitting: Vascular Surgery

## 2023-09-25 VITALS — BP 135/86 | HR 96 | Temp 98.0°F | Resp 18 | Ht 59.0 in | Wt 141.0 lb

## 2023-09-25 DIAGNOSIS — I83812 Varicose veins of left lower extremities with pain: Secondary | ICD-10-CM

## 2023-09-25 HISTORY — PX: LASER ABLATION: SHX1947

## 2023-09-25 NOTE — Progress Notes (Signed)
 Patient name: Stephanie Dougherty MRN: [id number] DOB: [birth year] Sex: female  REASON FOR VISIT: Treatment of left lower extremity chronic venous insufficiency with painful edema and varicosities of the medial thigh and leg  HPI: Stephanie Dougherty is a 53 y.o. female with history of bilateral lower extremity C3 venous disease with painful varicosities.  She has been compliant with thigh-high compression stockings and presents today for treatment of the left lower extremity refluxing great saphenous vein as well as multiple varicosities of the medial thigh and medial leg.     All information obtained via interpreter.  Current Outpatient Medications  Medication Sig Dispense Refill   amLODipine (NORVASC) 2.5 MG tablet Take 1 tablet (2.5 mg total) by mouth daily. 30 tablet 0   Cholecalciferol (VITAMIN D3) 25 MCG (1000 UT) CAPS Take 1,000 Units by mouth daily.     lisinopril (ZESTRIL) 20 MG tablet Take 1 tablet (20 mg total) by mouth daily. 90 tablet 3   LORazepam (ATIVAN) 1 MG tablet Take 1 tablet 30 to 60 minutes prior to leaving house on day of office surgery.  Bring second tablet with you to office on day of office surgery. 2 tablet 0   pantoprazole (PROTONIX) 40 MG tablet Take 40 mg by mouth as needed (for indigestion heartburn acid reflux).     No current facility-administered medications for this visit.    PHYSICAL EXAM: Vitals:   09/25/23 1513  BP: 135/86  Pulse: 96  Resp: 18  Temp: 98 F (36.7 C)  SpO2: 98%   Awake alert Nonlabored respirations Left lower extremity varicosities marked with the patient standing and great saphenous vein mapped with her laying supine using ultrasound  PROCEDURE: Left greater saphenous vein laser ablation totaling 17 cm and stab phlebectomy of the left medial thigh and leg and anterior lower leg totaling 20 sites  TECHNIQUE: Mrs. Stephanie Dougherty was taken to the procedure room where she was initially consented and agreed to proceed with  treatment of left lower extremity chronic venous insufficiency with stab phlebectomy of varicosities totaling greater than 20.  In standing position her varicosities of her left medial thigh and leg and anterior leg were then marked she was then laid supine on the procedural table and the great saphenous vein was mapped using ultrasound.  She was then circumferentially sterilely prepped and draped in the left lower extremity and timeout was called.  Ultrasound was then used to reidentify the left greater saphenous vein above the knee and this area was anesthetized 1% lidocaine and the vein was cannulated with direct ultrasound visualization with micropuncture needle followed by wire and a sheath.  A J-wire was then placed a few centimeters from the saphenofemoral junction and there was noted to be a large sidebranch coming off cephalad to this area and the laser introducer sheath was placed followed by the laser under direct ultrasound visualization.  The left greater saphenous vein was then anesthetized with tumescent anesthesia throughout the laser course and the vein was then ablated for a total of 17 cm.  At completion the left common femoral vein was noted to be patent and compressible.  Attention was then turned to the multiple varicosities of the left lower extremity including the left medial thigh as well as medial leg and anterior leg.  20 stab incisions were created and the varicosities were grabbed with crochet hook and hemostat and they were removed.  At completion Steri-Strips were placed followed by a sterile compressive wrap.  She  tolerated the procedure well without any complication.  Plan will be for follow-up in 2 weeks with post ablation duplex and we can schedule treatment of right lower extremity symptomatic varicosities at that time.  Stephanie Dougherty [facility] [phone]

## 2023-09-25 NOTE — Progress Notes (Signed)
     Laser Ablation Procedure    Date: 09/25/2023 [facility] DOB:05-25-1971 Consent signed: Yes     Surgeon: Dr.Brandon cain Procedure: Laser Ablation: left Greater Saphenous Vein BP 135/86 (BP Location: Left Arm, Patient Position: Sitting, Cuff Size: Normal)   Pulse 96   Temp 98 F (36.7 C) (Temporal)   Resp 18   Ht 4\' 11"  (1.499 m)   Wt 141 lb (64 kg)   LMP  (LMP Unknown)   SpO2 98%   BMI 28.48 kg/m   Tumescent Anesthesia: 450 cc 0.9% NaCl with 50 cc Lidocaine HCL 1%  and 15 cc 8.4% NaHCO3 Local Anesthesia: 12 cc Lidocaine HCL and NaHCO3 (ratio 2:1) 7 watts continuous mode    Total energy: 853.8 joules    Total time: 121 seconds Treatment Length: 17 cm Laser Fiber Ref. #  [id number]     Lot # [id number]  Stab Phlebectomy: >20 Sites: Thigh and Calf  Patient tolerated procedure well  Notes: All staff members wore facial masks. Pt has 1 tab of ( 1 MG ) ativan prior to surgical procedure at 14:45 pm. [employer] interpreter Elmyra Ricks # [phone] helped with translation during the case    Description of Procedure: After marking the course of the secondary varicosities, the patient was placed on the operating table in the supine position, and the left leg was prepped and draped in sterile fashion.   Local anesthetic was administered and under ultrasound guidance the saphenous vein was accessed with a micro needle and guide wire; then the mirco puncture sheath was placed.  A guide wire was inserted saphenofemoral junction , followed by a 5 french sheath.  The position of the sheath and then the laser fiber below the junction was confirmed using the ultrasound.  Tumescent anesthesia was administered along the course of the saphenous vein using ultrasound guidance. The patient was placed in Trendelenburg position and protective laser glasses were placed on patient and staff, and the laser was fired at 7 watts continuous mode for a total of 853.8 joules.  For stab phlebectomies, local  anesthetic was administered at the previously marked varicosities, and tumescent anesthesia was administered around the vessels.  Greater than 20 stab wounds were made using the tip of an 11 blade. And using the vein hook, the phlebectomies were performed using a hemostat to avulse the varicosities.  Adequate hemostasis was achieved.   Steri strips were applied to the stab wounds and ABD pads and thigh high compression stockings were applied.  Ace wrap bandages were applied over the phlebectomy sites and at the top of the saphenofemoral junction. Blood loss was less than 15 cc.  Discharge instructions reviewed with patient and hardcopy of discharge instructions given to patient to take home. The patient ambulated out of the operating room having tolerated the procedure well.

## 2023-10-01 ENCOUNTER — Telehealth: Payer: Self-pay | Admitting: *Deleted

## 2023-10-01 NOTE — Telephone Encounter (Signed)
 Stephanie Dougherty (patient's son and emergency contact) is calling in on behalf of his mother (who is Spanish speaking).  Stephanie Dougherty states that his mother had a small amount of drainage from a steristrip covering a stab phlebectomy incision site on her upper left thigh. He states his mother has no pain or redness at the site, is not running any fever, and the drainage has no odor. Encouraged Stephanie Dougherty to have his mother continue to wear thigh high compression hose daily and keep her left leg elevated when sitting.  Asked him to call [facility] if his mother experiences pain around the incision site, fever, or drainage with color or odor from the incision site.  Stephanie Dougherty verbalized understanding.

## 2023-10-08 ENCOUNTER — Other Ambulatory Visit: Payer: Self-pay | Admitting: *Deleted

## 2023-10-08 ENCOUNTER — Encounter: Payer: Self-pay | Admitting: Vascular Surgery

## 2023-10-08 ENCOUNTER — Ambulatory Visit (INDEPENDENT_AMBULATORY_CARE_PROVIDER_SITE_OTHER): Payer: Self-pay | Admitting: Vascular Surgery

## 2023-10-08 ENCOUNTER — Ambulatory Visit (HOSPITAL_COMMUNITY)
Admission: RE | Admit: 2023-10-08 | Discharge: 2023-10-08 | Disposition: A | Discharge status: Home / Self Care | Payer: Self-pay | Source: Ambulatory Visit | Attending: Vascular Surgery | Admitting: Vascular Surgery

## 2023-10-08 VITALS — BP 162/96 | HR 89 | Temp 97.9°F | Ht 59.0 in | Wt 138.0 lb

## 2023-10-08 DIAGNOSIS — I83811 Varicose veins of right lower extremities with pain: Secondary | ICD-10-CM

## 2023-10-08 DIAGNOSIS — I8393 Asymptomatic varicose veins of bilateral lower extremities: Secondary | ICD-10-CM

## 2023-10-08 DIAGNOSIS — I83812 Varicose veins of left lower extremities with pain: Secondary | ICD-10-CM

## 2023-10-08 NOTE — Progress Notes (Signed)
 Patient ID: Stephanie Dougherty, female   DOB: 09/14/1970, 53 y.o.   MRN: [id number]  Reason for Consult: Routine Post Op   Referred by No ref. provider found  Subjective:     HPI:  Stephanie Dougherty is a 53 y.o. 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  She has now undergone left great saphenous vein ablation and stab phlebectomy.  She is recovering well although does have some pain and had some oozing from above the knee site.  Continues to have swelling and painful varicosities of the right lower extremity.  All information obtained via interpreter.  Past Medical History:  Diagnosis Date   Anxiety    Blood transfusion without reported diagnosis    Chest pain    Diabetes mellitus without complication (HCC)    Hyperlipidemia    Hypertension 2019   Peripheral vascular disease (HCC)    Prediabetes    Family History  Problem Relation Age of Onset   Hypertension Mother    Diabetes Father    Hypertension Father    Lung disease Sister    Deep vein thrombosis Sister    Diabetes Brother    Diabetes Brother    Celiac disease Brother    Hiatal hernia Brother    Diabetes Brother    Obesity Son    Obesity Son    Past Surgical History:  Procedure Laterality Date   ABDOMINAL HYSTERECTOMY     No oophorectomy; emergent hysterectomy for postpartum bleeding.   CHOLECYSTECTOMY  2001   LASER ABLATION Left 09/25/2023   Laser ablation of the left greater saphenous vein with >20 stab phlebectomies    Short Social History:  Social History   Tobacco Use   Smoking status: Never   Smokeless tobacco: Never  Substance Use Topics   Alcohol use: No    Allergies  Allergen Reactions   Amlodipine Other (See Comments)    amlodipine   Losartan Palpitations    Current Outpatient Medications  Medication Sig Dispense Refill   amLODipine (NORVASC) 2.5 MG tablet Take 1 tablet (2.5 mg total) by mouth daily. 30 tablet 0   Cholecalciferol (VITAMIN D3)  25 MCG (1000 UT) CAPS Take 1,000 Units by mouth daily.     lisinopril (ZESTRIL) 20 MG tablet Take 1 tablet (20 mg total) by mouth daily. 90 tablet 3   pantoprazole (PROTONIX) 40 MG tablet Take 40 mg by mouth as needed (for indigestion heartburn acid reflux).     No current facility-administered medications for this visit.    Review of Systems  Constitutional:  Constitutional negative. HENT: HENT negative.  Eyes: Eyes negative.  Respiratory: Respiratory negative.  Cardiovascular: Positive for leg swelling.  GI: Gastrointestinal negative.  Musculoskeletal: Positive for leg pain.  Neurological: Neurological negative. Hematologic: Hematologic/lymphatic negative.  Psychiatric: Psychiatric negative.        Objective:  Objective  Vitals:   10/08/23 1215  BP: (!) 162/96  Pulse: 89  Temp: 97.9 F (36.6 C)  SpO2: 96%     Physical Exam     Data: Indications: Post-op left Greater Saphenous vein ablation 09/25/2023.    Performing Technologist: Elita Quick RVT     Examination Guidelines: A complete evaluation includes B-mode imaging,  spectral  Doppler, color Doppler, and power Doppler as needed of all accessible  portions  of each vessel. Bilateral testing is considered an integral part of a  complete  examination. Limited examinations for reoccurring indications may be  performed  as noted. The  reflux portion of the exam is performed with the patient in  reverse Trendelenburg.  Significant venous reflux is defined as >500 ms in the superficial venous  system, and >1 second in the deep venous system.         Summary:  Left:  - No evidence of deep vein thrombosis seen in the left lower extremity,  from the common femoral through the popliteal veins.   - No color flow or doppler signal in the Greater Saphenous vein from the  medial knee to the proximal to mid segment of the thigh. The branches off  the proximal and mid thigh appears closed.      Assessment/Plan:     53 year old female status post treatment of C3 venous disease left lower extremity with great saphenous vein ablation and stab phlebectomy of approximately 20 sites.  She continues thigh-high compression stockings bilaterally and is recovering well from the left side standpoint.  We will now plan to proceed with right lower extremity great saphenous vein ablation and again will need greater than 20 areas of microphlebectomy.  We have again reiterated the risk and benefits and she demonstrates good understanding.  All of this discussion was held via interpreter today in conjunction with Domenic Moras, RN.  She will be scheduled in the near future.     Maeola Harman MD [facility]

## 2023-10-16 ENCOUNTER — Ambulatory Visit: Payer: Self-pay | Admitting: Hematology and Oncology

## 2023-10-16 ENCOUNTER — Ambulatory Visit
Admission: RE | Admit: 2023-10-16 | Discharge: 2023-10-16 | Disposition: A | Discharge status: Home / Self Care | Payer: Self-pay | Source: Ambulatory Visit | Attending: Obstetrics and Gynecology | Admitting: Obstetrics and Gynecology

## 2023-10-16 VITALS — BP 153/100 | Wt 138.0 lb

## 2023-10-16 DIAGNOSIS — Z1231 Encounter for screening mammogram for malignant neoplasm of breast: Secondary | ICD-10-CM

## 2023-10-16 NOTE — Patient Instructions (Signed)
 Taught Rubin Payor about self breast awareness and gave educational materials to take home. Patient did not need a Pap smear today due to last Pap smear was 1-2 years ago per patient. Let her know [facility] will cover Pap smears every 5 years unless has a history of abnormal Pap smears. Referred patient to the [facility] for screening mammogram. Appointment scheduled for 10/16/2023. Patient aware of appointment and will be there. Let patient know will follow up with her within the next couple weeks with results. [facility] verbalized understanding.  Pascal Lux, NP 9:42 AM

## 2023-10-16 NOTE — Progress Notes (Signed)
 Ms. Stephanie Dougherty is a 52 y.o. female who presents to [facility] clinic today with no complaints.    Pap Smear: Pap not smear completed today. Last Pap smear was 1 or 2 years ago. Patient had hysterectomy approximately 16 years ago; however, was told she will continue to need routine Pap smears as her cervix was left during surgery. We will [facility] [place].   Physical exam: Breasts Breasts symmetrical. No skin abnormalities bilateral breasts. No nipple retraction bilateral breasts. No nipple discharge bilateral breasts. No lymphadenopathy. No lumps palpated bilateral breasts.       Pelvic/Bimanual Pap is not indicated today    Smoking History: Patient has never smoked and was not referred to quit line.    Patient Navigation: Patient education provided. Access to services provided for patient through [facility] program. Natale Lay interpreter provided. No transportation provided   Colorectal Cancer Screening: Per patient has had colonoscopy completed on 03/05/2022 revealing surgical, colon, ascending, cecum and transverse, polyp (4) - tubular adenoma without high grade dysplasia.  No complaints today.    Breast and Cervical Cancer Risk Assessment: Patient does not have family history of breast cancer, known genetic mutations, or radiation treatment to the chest before age 35. Patient does not have history of cervical dysplasia, immunocompromised, or DES exposure in-utero.  Risk Scores as of Encounter on 10/16/2023     Stephanie Dougherty           5-year 0.5%   Lifetime 5.13%            Last calculated by Caprice Red, CMA on 10/16/2023 at  9:15 AM        A: [facility] exam without pap smear No complaints with benign exam.   P: Referred patient to the [facility] for a screening mammogram. Appointment scheduled 10/16/2022.  Pascal Lux, NP 10/16/2023 9:38 AM

## 2023-11-11 ENCOUNTER — Telehealth: Payer: Self-pay | Admitting: *Deleted

## 2023-11-11 NOTE — Telephone Encounter (Signed)
 Returning Stephanie Dougherty's telephone message thru her son yesterday regarding post procedure questions regarding her left leg. Stephanie Dougherty had endovenous laser ablation left greater saphenous vein and stab phlebectomy > 20 incisions left leg on 09-25-2023 by Maceo Pro MD.  [facility] Spanish interpreter used for telephone conversation with Stephanie Dougherty.  Reviewed with Stephanie Dougherty that her venous duplex (left leg) on 10-08-2023 showed that her left greater saphenous vein was closed and no DVT was present in left lower extremity.  Stephanie Dougherty states that she is experiencing numb sensation in her left foot, toes, and ankle.  She also states she feels pain behind her left knee and to the side of her left knee.  Stephanie Dougherty also states she is able to walk slowly but is unable to walk quickly.  Dr. Randie Heinz is in the [facility] office tomorrow.  Will discuss Stephanie Dougherty's symptoms with him and call her back tomorrow with assistance of a Spanish interpreter.  Stephanie Dougherty understanding of the plan.

## 2023-11-12 ENCOUNTER — Other Ambulatory Visit: Payer: Self-pay | Admitting: *Deleted

## 2023-11-12 ENCOUNTER — Telehealth: Payer: Self-pay | Admitting: *Deleted

## 2023-11-12 DIAGNOSIS — I83812 Varicose veins of left lower extremities with pain: Secondary | ICD-10-CM

## 2023-11-12 NOTE — Telephone Encounter (Signed)
 Follow up call with Mrs. Calderon regarding left leg pain and numbness post laser ablation left greater saphenous vein and stab phlebectomy > 20 incisions left leg on 09-25-2023 .  Reviewed patients concerns and symptoms with Dr. Randie Heinz.  He requested a venous duplex (left leg, order in EPIC) and post LA VV FU with him to evaluate left leg.  Called Mrs. Calderon via [place] interpreter [facility] Interpreter/ Spanish/ ID # [id number]) and conveyed Dr. Darcella Cheshire recommendation. Appointments scheduled for 11-26-2023 10:30 AM (with 10:15 AM arrival) for post LA venous duplex of left leg and VV FU with Dr. Randie Heinz at 11:20 AM on 11-26-2023.  Appointments will be at [address] [place] [address].  Patient verbalized agreement with this plan.

## 2023-11-17 ENCOUNTER — Other Ambulatory Visit: Payer: Self-pay

## 2023-11-17 DIAGNOSIS — I83812 Varicose veins of left lower extremities with pain: Secondary | ICD-10-CM

## 2023-11-26 ENCOUNTER — Ambulatory Visit (INDEPENDENT_AMBULATORY_CARE_PROVIDER_SITE_OTHER): Payer: Self-pay | Admitting: Vascular Surgery

## 2023-11-26 ENCOUNTER — Encounter: Payer: Self-pay | Admitting: Vascular Surgery

## 2023-11-26 ENCOUNTER — Ambulatory Visit (HOSPITAL_COMMUNITY)
Admission: RE | Admit: 2023-11-26 | Discharge: 2023-11-26 | Disposition: A | Discharge status: Home / Self Care | Payer: Self-pay | Source: Ambulatory Visit | Attending: Vascular Surgery | Admitting: Vascular Surgery

## 2023-11-26 ENCOUNTER — Other Ambulatory Visit: Payer: Self-pay | Admitting: *Deleted

## 2023-11-26 VITALS — BP 143/90 | HR 85 | Temp 98.2°F | Resp 20 | Ht 59.0 in | Wt 139.0 lb

## 2023-11-26 DIAGNOSIS — I8393 Asymptomatic varicose veins of bilateral lower extremities: Secondary | ICD-10-CM

## 2023-11-26 DIAGNOSIS — I83812 Varicose veins of left lower extremities with pain: Secondary | ICD-10-CM | POA: Insufficient documentation

## 2023-11-26 MED ORDER — LORAZEPAM 1 MG PO TABS: ORAL_TABLET | ORAL | 0 refills | Status: DC

## 2023-11-26 NOTE — Progress Notes (Signed)
 Subjective:     Patient ID: Stephanie Dougherty, female   DOB: [birth year], 53 y.o.   MRN: [id number]  HPI 53 year old female status post treatment of C3 venous disease with left lower extremity great saphenous vein ablation and stab phlebectomy approximately 20 sites she states that she has had numbness particularly around her medial left knee tracing down the incision sites of the left medial leg and on the left foot since 1 day post procedure.  She has not been taking any anti-inflammatory medications and has not been wearing compression due to irritation.  She states that this is somewhat resolving but she is just concerned that something is wrong.  She is scheduled for procedure on the right lower extremity next week.   Review of Systems As above    Objective:   Physical Exam Vitals:   11/26/23 1056  BP: (!) 143/90  Pulse: 85  Resp: 20  Temp: 98.2 F (36.8 C)  SpO2: 99%    There is a palpable cord on the posterior thigh on the left and I can palpate multiple areas of induration around the medial and posterior calf on the left     LEFT     CompressibilityPhasicitySpontaneityPropertiesThrombus  Aging  +---------+---------------+---------+-----------+----------+--------------+   CFV     Full           Yes      Yes                                   +---------+---------------+---------+-----------+----------+--------------+   SFJ     Full                    Yes                                   +---------+---------------+---------+-----------+----------+--------------+   FV Prox  Full           Yes      Yes                                   +---------+---------------+---------+-----------+----------+--------------+   FV Mid   Full           Yes      Yes                                   +---------+---------------+---------+-----------+----------+--------------+   FV DistalFull           Yes      Yes                                    +---------+---------------+---------+-----------+----------+--------------+   POP     Full           Yes      Yes                                   +---------+---------------+---------+-----------+----------+--------------+   PTV     Full                    Yes                                   +---------+---------------+---------+-----------+----------+--------------+  PERO    Full                    Yes                                   +---------+---------------+---------+-----------+----------+--------------+      Summary:  LEFT:  - No evidence of deep vein reflux. GSV remains thrombosed from the  prox/mid thigh distally        Assessment/plan     53 year old female with persistent symptoms of numbness and pain after great saphenous vein ablation and stab lipectomy on the left.  Appears to have continued inflammation I recommended ibuprofen with meals 200 mg as tolerated.  We discussed that this should likely resolve within weeks to months and she should continue compression stockings as tolerated.  We are planning to continue with right greater saphenous vein ablation and stab phlebectomy greater than 20 for symptomatic varicosities on the right.  All questions were answered via interpreter and she demonstrates full understanding.    Aubri Gathright C. Vikki Graves, MD [facility] [place] Office: [phone] Pager: [phone]

## 2023-12-04 ENCOUNTER — Encounter: Payer: Self-pay | Admitting: Vascular Surgery

## 2023-12-04 ENCOUNTER — Ambulatory Visit: Payer: Self-pay | Attending: Vascular Surgery | Admitting: Vascular Surgery

## 2023-12-04 VITALS — BP 143/87 | HR 86 | Temp 98.6°F | Resp 16 | Ht 59.06 in | Wt 139.0 lb

## 2023-12-04 DIAGNOSIS — I83811 Varicose veins of right lower extremities with pain: Secondary | ICD-10-CM

## 2023-12-04 HISTORY — PX: LASER ABLATION: SHX1947

## 2023-12-04 NOTE — Progress Notes (Signed)
 Patient name: Stephanie Dougherty MRN: [id number] DOB: 03-17-1971 Sex: female  REASON FOR VISIT: Treatment of right lower extremity chronic venous insufficiency with painful edema and varicosities of the medial and anterior leg  HPI: Stephanie Dougherty is a 53 y.o. female with history of bilateral lower extremity C3 venous disease status post treatment on the left.  She did have some prolonged pain on the left lower extremity no evidence of DVT was likely secondary to thrombophlebitis.  She does have painful varicosities and edema of the right lower extremity and she has been compliant with thigh-high compression stockings and now presents for treatment.  Current Outpatient Medications  Medication Sig Dispense Refill   amLODipine  (NORVASC ) 2.5 MG tablet Take 1 tablet (2.5 mg total) by mouth daily. 30 tablet 0   Cholecalciferol (VITAMIN D3) 25 MCG (1000 UT) CAPS Take 1,000 Units by mouth daily.     lisinopril  (ZESTRIL ) 20 MG tablet Take 1 tablet (20 mg total) by mouth daily. 90 tablet 3   LORazepam  (ATIVAN ) 1 MG tablet Take 1 tablet 30 to 60 minutes prior to leaving house on day of office surgery.  Bring second tablet with you to office on day of office surgery. 2 tablet 0   Omega-3 Fatty Acids (OMEGA 3 PO) Take by mouth.     pantoprazole  (PROTONIX ) 40 MG tablet Take 40 mg by mouth as needed (for indigestion heartburn acid reflux).     No current facility-administered medications for this visit.    PHYSICAL EXAM: Vitals:   12/04/23 0913  BP: (!) 143/87  Pulse: 86  Resp: 16  Temp: 98.6 F (37 C)  SpO2: 98%   Awake alert and oriented Nonlabored respirations Varicosities of the right lower extremity marked with the patient standing of the medial and anterior leg  PROCEDURE: Right greater saphenous vein ablation totaling 15 cm and stab phlebectomy of the right medial and anterior leg totaling 20 sites  TECHNIQUE: Stephanie Dougherty was initially taken to the procedure room where  she was consent was signed and she agreed to proceed with treatment of the right lower extremity.  She was then evaluated in the standing position and varicosities of the right medial anterior leg were marked.  She was then laid supine on the procedural table and the great saphenous vein was mapped and she was sterilely prepped and draped in the right lower extremity in the usual fashion a timeout was called.  Ultrasound was used to identify the very large great saphenous vein just above the knee and the area was anesthetized 1% lidocaine and this was cannulated with direct ultrasound visualization with a micropuncture needle followed by wire and a sheath.  An 018 wire was then placed under ultrasound guidance up to the saphenofemoral junction and a 25 cm laser catheter was placed a few centimeters short of saphenofemoral junction to a total of 15 cm.  Laser was then placed and tumescent anesthesia was instilled around the great saphenous vein.  Laser was then ablated for a total of 15 cm and a completion of the right common femoral vein was noted to be patent and compressible.  Attention was then turned to the multiple varicosities of the right anterior and medial leg.  1% lidocaine was instilled and tumescent anesthesia was instilled as well around the varicosities and 20 and symptoms were created and the veins were grabbed with crochet hook and hemostat and removed.  At completion Steri-Strips were placed followed by sterile compressive wrap.  She tolerated procedure without any complication.  Plan will be to follow-up with post laser ablation duplex of the right lower extremity.  Angela Kell [facility] [place] [phone]

## 2023-12-04 NOTE — Progress Notes (Addendum)
     Laser Ablation Procedure    Date: 12/04/2023 [facility] DOB:1971-04-19 Consent signed: Yes     Surgeon: Dr. Nakaila Freeze Kell Procedure: Laser Ablation: right Greater Saphenous Vein  BP (!) 143/87 (BP Location: Left Arm, Patient Position: Sitting, Cuff Size: Normal)   Pulse 86   Temp 98.6 F (37 C) (Temporal)   Resp 16   Ht 4' 11.06" (1.5 m)   Wt 139 lb (63 kg)   LMP  (LMP Unknown)   SpO2 98%   BMI 28.02 kg/m   Tumescent Anesthesia: 400 cc 0.9% NaCl with 50 cc Lidocaine HCL 1%  and 15 cc 8.4% NaHCO3 Local Anesthesia: 20 cc Lidocaine HCL and NaHCO3 (ratio 2:1) 7 watts continuous mode    Total energy: 753.9 joules    Total time: 107 seconds Treatment Length 15 cm Laser Fiber Ref. #  [id number]         Lot #[id number]  Stab Phlebectomy: >20 Sites: Thigh and Calf  Patient tolerated procedure well Notes: All staff members wore facial masks. Pt had 1 mg ativan  at 7:30 am.   Description of Procedure: After marking the course of the secondary varicosities, the patient was placed on the operating table in the supine position, and the right leg was prepped and draped in sterile fashion.   Local anesthetic was administered and under ultrasound guidance the saphenous vein was accessed with a micro needle and guide wire; then the mirco puncture sheath was placed.  A guide wire was inserted saphenofemoral junction , followed by a 5 french sheath.  The position of the sheath and then the laser fiber below the junction was confirmed using the ultrasound.  Tumescent anesthesia was administered along the course of the saphenous vein using ultrasound guidance. The patient was placed in Trendelenburg position and protective laser glasses were placed on patient and staff, and the laser was fired at 7 watts continuous mode for a total of 753.9 joules. For stab phlebectomies, local anesthetic was administered at the previously marked varicosities, and tumescent anesthesia was administered around the  vessels.  Greater than 20 stab wounds were made using the tip of an 11 blade. And using the vein hook, the phlebectomies were performed using a hemostat to avulse the varicosities.  Adequate hemostasis was achieved.   Steri strips were applied to the stab wounds and ABD pads and thigh high compression stockings were applied.  Ace wrap bandages were applied over the phlebectomy sites and at the top of the saphenofemoral junction. Blood loss was less than 15 cc.  Discharge instructions reviewed with patient and hardcopy of discharge instructions given to patient to take home. The patient was wheeled out of the operating room having tolerated the procedure well.

## 2023-12-18 ENCOUNTER — Encounter: Payer: Self-pay | Admitting: Vascular Surgery

## 2023-12-18 ENCOUNTER — Ambulatory Visit (INDEPENDENT_AMBULATORY_CARE_PROVIDER_SITE_OTHER): Payer: Self-pay | Admitting: Vascular Surgery

## 2023-12-18 ENCOUNTER — Ambulatory Visit
Admission: RE | Admit: 2023-12-18 | Discharge: 2023-12-18 | Disposition: A | Discharge status: Home / Self Care | Payer: Self-pay | Source: Ambulatory Visit | Attending: Vascular Surgery | Admitting: Vascular Surgery

## 2023-12-18 VITALS — BP 145/94 | HR 83 | Temp 97.9°F | Resp 16 | Ht 59.0 in | Wt 140.3 lb

## 2023-12-18 DIAGNOSIS — I83811 Varicose veins of right lower extremities with pain: Secondary | ICD-10-CM

## 2023-12-18 DIAGNOSIS — I83812 Varicose veins of left lower extremities with pain: Secondary | ICD-10-CM

## 2023-12-18 NOTE — Progress Notes (Signed)
     Subjective:     Patient ID: Stephanie Dougherty, female   DOB: 09/19/1970, 53 y.o.   MRN: [id number]  HPI 53 year old female status post right greater saphenous vein ablation and stab phlebectomy totaling 20 sites.  She is recovering well.  She does have persistent numbness of the left foot.   Review of Systems Continued numbness of left foot    Objective:   Physical Exam Vitals:   12/18/23 1012  BP: (!) 145/94  Pulse: 83  Resp: 16  Temp: 97.9 F (36.6 C)  SpO2: 99%        Venous Reflux Times  +---------+---------+------+-----------+------------+--------+  RIGHT   Reflux NoRefluxReflux TimeDiameter cmsComments                     Yes                                   +---------+---------+------+-----------+------------+--------+  CFV                                           Patent    +---------+---------+------+-----------+------------+--------+  FV mid                                         Patent    +---------+---------+------+-----------+------------+--------+  Popliteal                                     Patent    +---------+---------+------+-----------+------------+--------+         Summary:  Right:  - No evidence of deep vein thrombosis from the common femoral through the  popliteal veins.    - Successful ablation of the great saphenous vein from the knee to  approximately 12.0 cm from the SFJ.      Assessment/plan     53 year old female now status post treatment of bilateral great saphenous vein reflux and stab phlebectomy for symptomatic varicosities.  She continues to have numbness of the left foot but this is resolving.  The right leg is recovering well with no evidence of DVT although the vein was only ablated up to 12 cm hopefully this will not have any recurrence given that all of her varicosities were below the knee.  This was discussed with her via an interpreter today and all of her questions were  answered.  She can call on an as-needed basis, we discussed that if she has persistent numbness of the left lower extremity we could consider adding Neurontin to her medication regimen.   Stephanie Shimer C. Vikki Graves, MD [facility] [place] Office: [phone] Pager: [phone]

## 2023-12-30 ENCOUNTER — Telehealth: Payer: Self-pay | Admitting: Licensed Clinical Social Worker

## 2023-12-30 NOTE — Telephone Encounter (Signed)
 H&V Care Navigation CSW Progress Note  Clinical Social Worker contacted patient by phone to f/u on patient CAFA renewal if needed. Note that previous application expired 12/28/23. No upcoming HRT/VAS appts- should pt need assistance with renewal remain available with scheduled appt.  Patient is participating in a Managed Medicaid Plan:  No, self pay only  SDOH Screenings   Food Insecurity: No Food Insecurity (10/16/2023)  Housing: Low Risk  (10/03/2022)  Transportation Needs: No Transportation Needs (10/16/2023)  Utilities: Not At Risk (10/03/2022)  Financial Resource Strain: Medium Risk (10/03/2022)  Physical Activity: Sufficiently Active (04/20/2019)  Social Connections: Unknown (10/17/2022)   Received from [facility], [facility]  Tobacco Use: Low Risk  (12/18/2023)    Nathen Balder, MSW, LCSW Clinical Social Worker II [facility] Heart/Vascular Care Navigation  [phone]- work cell phone (preferred)

## 2024-02-28 ENCOUNTER — Other Ambulatory Visit: Payer: Self-pay | Admitting: Internal Medicine
